# Patient Record
Sex: Male | Born: 1985 | Race: White | Hispanic: No | Marital: Single | Smoking: Never smoker
Health system: Southern US, Community
[De-identification: ages and names within clinical notes are randomized; demographics above are authoritative.]

## PROBLEM LIST (undated history)

## (undated) HISTORY — PX: KNEE SURGERY: SHX244

---

## 2009-04-10 ENCOUNTER — Observation Stay (HOSPITAL_COMMUNITY): Admission: EM | Admit: 2009-04-10 | Discharge: 2009-04-10 | Payer: Self-pay | Admitting: Emergency Medicine

## 2009-04-12 ENCOUNTER — Emergency Department (HOSPITAL_COMMUNITY): Admission: EM | Admit: 2009-04-12 | Discharge: 2009-04-12 | Payer: Self-pay | Admitting: Emergency Medicine

## 2009-05-06 ENCOUNTER — Emergency Department (HOSPITAL_BASED_OUTPATIENT_CLINIC_OR_DEPARTMENT_OTHER): Admission: EM | Admit: 2009-05-06 | Discharge: 2009-05-06 | Payer: Self-pay | Admitting: Emergency Medicine

## 2010-05-23 LAB — DIFFERENTIAL
Basophils Absolute: 0 10*3/uL (ref 0.0–0.1)
Basophils Relative: 0 % (ref 0–1)
Neutro Abs: 4 10*3/uL (ref 1.7–7.7)

## 2010-05-23 LAB — CBC
Hemoglobin: 15.5 g/dL (ref 13.0–17.0)
MCHC: 35.5 g/dL (ref 30.0–36.0)
MCV: 91.7 fL (ref 78.0–100.0)
WBC: 5.5 10*3/uL (ref 4.0–10.5)

## 2010-05-23 LAB — URINALYSIS, ROUTINE W REFLEX MICROSCOPIC
Glucose, UA: NEGATIVE mg/dL
Hgb urine dipstick: NEGATIVE
Hgb urine dipstick: NEGATIVE
Ketones, ur: 40 mg/dL — AB
Ketones, ur: NEGATIVE mg/dL
Leukocytes, UA: NEGATIVE
Nitrite: NEGATIVE
Specific Gravity, Urine: 1.031 — ABNORMAL HIGH (ref 1.005–1.030)
Specific Gravity, Urine: 1.031 — ABNORMAL HIGH (ref 1.005–1.030)
pH: 6 (ref 5.0–8.0)

## 2010-05-23 LAB — POCT I-STAT, CHEM 8
Calcium, Ion: 1.05 mmol/L — ABNORMAL LOW (ref 1.12–1.32)
Creatinine, Ser: 1.5 mg/dL (ref 0.4–1.5)
Glucose, Bld: 118 mg/dL — ABNORMAL HIGH (ref 70–99)
HCT: 47 % (ref 39.0–52.0)
Hemoglobin: 16 g/dL (ref 13.0–17.0)
TCO2: 24 mmol/L (ref 0–100)

## 2010-05-23 LAB — COMPREHENSIVE METABOLIC PANEL
ALT: 18 U/L (ref 0–53)
Albumin: 3.6 g/dL (ref 3.5–5.2)
Alkaline Phosphatase: 41 U/L (ref 39–117)
BUN: 7 mg/dL (ref 6–23)
Calcium: 8.7 mg/dL (ref 8.4–10.5)
Creatinine, Ser: 1.15 mg/dL (ref 0.4–1.5)
GFR calc Af Amer: >60 mL/min (ref >60)
GFR calc non Af Amer: >60 mL/min (ref >60)
Glucose, Bld: 95 mg/dL (ref 70–99)
Total Bilirubin: 1.1 mg/dL (ref 0.3–1.2)

## 2010-05-23 LAB — URINE MICROSCOPIC-ADD ON

## 2010-05-23 LAB — HEMOCCULT GUIAC POC 1CARD (OFFICE): Fecal Occult Bld: NEGATIVE

## 2010-05-23 LAB — LIPASE, BLOOD: Lipase: 21 U/L (ref 11–59)

## 2010-05-27 LAB — DIFFERENTIAL
Basophils Absolute: 0 10*3/uL (ref 0.0–0.1)
Basophils Relative: 1 % (ref 0–1)
Eosinophils Absolute: 0 10*3/uL (ref 0.0–0.7)
Neutro Abs: 5.3 10*3/uL (ref 1.7–7.7)
Neutrophils Relative %: 69 % (ref 43–77)

## 2010-05-27 LAB — CBC
HCT: 44 % (ref 39.0–52.0)
Hemoglobin: 15.2 g/dL (ref 13.0–17.0)
WBC: 7.6 10*3/uL (ref 4.0–10.5)

## 2010-06-01 ENCOUNTER — Emergency Department: Payer: Self-pay | Admitting: Internal Medicine

## 2012-01-19 ENCOUNTER — Emergency Department (HOSPITAL_COMMUNITY)
Admission: EM | Admit: 2012-01-19 | Discharge: 2012-01-19 | Disposition: A | Discharge status: Home / Self Care | Payer: Self-pay | Attending: Emergency Medicine | Admitting: Emergency Medicine

## 2012-01-19 ENCOUNTER — Emergency Department (HOSPITAL_COMMUNITY): Payer: Self-pay

## 2012-01-19 ENCOUNTER — Encounter (HOSPITAL_COMMUNITY): Payer: Self-pay | Admitting: Emergency Medicine

## 2012-01-19 DIAGNOSIS — S46909A Unspecified injury of unspecified muscle, fascia and tendon at shoulder and upper arm level, unspecified arm, initial encounter: Secondary | ICD-10-CM | POA: Insufficient documentation

## 2012-01-19 DIAGNOSIS — S139XXA Sprain of joints and ligaments of unspecified parts of neck, initial encounter: Secondary | ICD-10-CM | POA: Insufficient documentation

## 2012-01-19 DIAGNOSIS — Y9289 Other specified places as the place of occurrence of the external cause: Secondary | ICD-10-CM | POA: Insufficient documentation

## 2012-01-19 DIAGNOSIS — Y939 Activity, unspecified: Secondary | ICD-10-CM | POA: Insufficient documentation

## 2012-01-19 DIAGNOSIS — S4980XA Other specified injuries of shoulder and upper arm, unspecified arm, initial encounter: Secondary | ICD-10-CM | POA: Insufficient documentation

## 2012-01-19 DIAGNOSIS — IMO0002 Reserved for concepts with insufficient information to code with codable children: Secondary | ICD-10-CM | POA: Insufficient documentation

## 2012-01-19 MED ORDER — CYCLOBENZAPRINE HCL 10 MG PO TABS: 10.0000 mg | ORAL_TABLET | Freq: Three times a day (TID) | ORAL | Status: DC | PRN

## 2012-01-19 MED ORDER — HYDROCODONE-ACETAMINOPHEN 5-500 MG PO TABS: 1.0000 | ORAL_TABLET | Freq: Four times a day (QID) | ORAL | Status: DC | PRN

## 2012-01-19 NOTE — ED Notes (Signed)
Patient transported to X-ray 

## 2012-01-19 NOTE — ED Notes (Signed)
C/O neck and shoulder pain x 2 weeks when he was struck in the helmet with a large cable. Denies numbness in arms or fingers. States had some dizziness this am when he bent over.

## 2012-01-19 NOTE — ED Provider Notes (Signed)
History  This chart was scribed for Carleene Cooper III, MD by Shari Heritage, ED Scribe. The patient was seen in room TR06C/TR06C. Patient's care was started at 0926.  CSN: [id number]  Arrival date & time 01/19/12  4098   First MD Initiated Contact with Patient 01/19/12 [phone]      Chief Complaint  Patient presents with  . Neck Pain    The history is provided by the patient. No language interpreter was used.    HPI Comments: Mike Hughes is a 26 y.o. male who presents to the Emergency Department complaining of moderate, constant, non-radiating, dull, gradually worsening neck and right shoulder pain onset 2 weeks ago. Pain is worse with movement. Patient denies numbness and weakness of extremities or LOC at time of incident. Patient states that he was hit with a steel cable while he was wearing a hard hat at work 2 weeks ago. Patient says that his neck jerked and the pain began shortly after the incident. Patient also states that he further aggravated the pain 3 days ago when there was another accident at work. Patient has taken Tylenol, Advil and a OTC pain patch with minimal relief. Patient denies any significant medical or surgical history. He does not take any regular medications. Patient has no known allergies. He does not smoke cigarettes or drink alcohol, but they he chews tobacco.  No family history on file.  History  Substance Use Topics  . Smoking status: Never Smoker   . Smokeless tobacco: Not on file  . Alcohol Use: No    Review of Systems  HENT: Positive for neck pain.   Musculoskeletal: Positive for myalgias.  Neurological: Negative for weakness and numbness.  All other systems reviewed and are negative.   Allergies  Review of patient's allergies indicates no known allergies.  Home Medications  No current outpatient prescriptions on file.  Triage Vitals: BP 139/81  Pulse 71  Temp 98.3 F (36.8 C) (Oral)  Resp 18  SpO2 97%  Physical Exam  Nursing note and  vitals reviewed. Constitutional: He is oriented to person, place, and time. He appears well-developed and well-nourished. No distress.  HENT:  Head: Normocephalic and atraumatic.  Nose: Nose normal.  Mouth/Throat: Oropharynx is clear and moist.  Eyes: Conjunctivae normal and EOM are normal. Pupils are equal, round, and reactive to light.  Neck: Neck supple. No tracheal deviation present.  Cardiovascular: Normal rate.   Pulmonary/Chest: Effort normal. No respiratory distress.  Abdominal: Soft. Bowel sounds are normal. He exhibits no distension. There is no tenderness. There is no rebound.  Musculoskeletal: Normal range of motion. He exhibits tenderness. He exhibits no edema.       Cervical back: He exhibits pain. He exhibits no bony tenderness and no deformity.       Localized pain to posterior cervical paraspinous muscles. No palpable bony deformity of neck. Limited ROM of neck due to pain.  Intact sensation and tendon function in arms and legs.  Neurological: He is alert and oriented to person, place, and time. No sensory deficit.  Skin: Skin is warm and dry.  Psychiatric: He has a normal mood and affect. His behavior is normal.    ED Course  Procedures (including critical care time) DIAGNOSTIC STUDIES: Oxygen Saturation is 97% on room air, adequate by my interpretation.    COORDINATION OF CARE: 9:36 AM- Patient informed of current plan for treatment and evaluation and agrees with plan at this time. Will order x-ray of C-spine.  Dg Cervical Spine Complete  01/19/2012  *RADIOLOGY REPORT*  Clinical Data: Pain.  CERVICAL SPINE - COMPLETE 4+ VIEW  Comparison: None.  Findings: The cervical spine is visualized from the occiput to the cervicothoracic junction.  There is straightening of the normal cervical lordosis.  Alignment is otherwise anatomic.  Vertebral body and disc space height are maintained.  Prevertebral soft tissues are within normal limits.  Neural foramina are patent.  Dens is obscured on the dedicated views.  No degenerative changes. Visualized portions of the lung bases are clear.  IMPRESSION: Straightening of the normal cervical lordosis.  Otherwise negative.   Original Report Authenticated By: Leanna Battles, M.D.      1. Cervical sprain     Rx Flexeril, hydrocodone-acetaminophen.  I personally performed the services described in this documentation, which was scribed in my presence. The recorded information has been reviewed and is accurate. Osvaldo Human, MD     Carleene Cooper III, MD 01/19/12 1011

## 2012-02-08 ENCOUNTER — Emergency Department (HOSPITAL_COMMUNITY): Payer: Self-pay

## 2012-02-08 ENCOUNTER — Emergency Department (HOSPITAL_COMMUNITY)
Admission: EM | Admit: 2012-02-08 | Discharge: 2012-02-08 | Disposition: A | Discharge status: Home / Self Care | Payer: Self-pay | Attending: Emergency Medicine | Admitting: Emergency Medicine

## 2012-02-08 ENCOUNTER — Encounter (HOSPITAL_COMMUNITY): Payer: Self-pay | Admitting: Family Medicine

## 2012-02-08 DIAGNOSIS — R0789 Other chest pain: Secondary | ICD-10-CM

## 2012-02-08 DIAGNOSIS — R071 Chest pain on breathing: Secondary | ICD-10-CM | POA: Insufficient documentation

## 2012-02-08 LAB — CBC WITH DIFFERENTIAL/PLATELET
HCT: 46.1 % (ref 39.0–52.0)
Hemoglobin: 16.4 g/dL (ref 13.0–17.0)
Lymphocytes Relative: 34 % (ref 12–46)
MCH: 31.8 pg (ref 26.0–34.0)
Monocytes Relative: 9 % (ref 3–12)
Neutro Abs: 3.4 10*3/uL (ref 1.7–7.7)
Neutrophils Relative %: 55 % (ref 43–77)
Platelets: 239 10*3/uL (ref 150–400)

## 2012-02-08 LAB — COMPREHENSIVE METABOLIC PANEL
Alkaline Phosphatase: 58 U/L (ref 39–117)
BUN: 10 mg/dL (ref 6–23)
CO2: 28 mEq/L (ref 19–32)
Chloride: 102 mEq/L (ref 96–112)
Glucose, Bld: 84 mg/dL (ref 70–99)
Potassium: 4.1 mEq/L (ref 3.5–5.1)
Sodium: 138 mEq/L (ref 135–145)

## 2012-02-08 LAB — POCT I-STAT TROPONIN I: Troponin i, poc: 0 ng/mL (ref 0.00–0.08)

## 2012-02-08 MED ORDER — MELOXICAM 15 MG PO TABS: 15.0000 mg | ORAL_TABLET | Freq: Every day | ORAL | Status: DC

## 2012-02-08 NOTE — ED Notes (Signed)
Per pt 4 days of central chest pain radiating into his left breast. sts hurts when he moves and breathes in. sts hard to take a deep breath. Denies cough, fever.

## 2012-02-10 NOTE — ED Provider Notes (Signed)
History     CSN: [id number]  Arrival date & time 02/08/12  1604   First MD Initiated Contact with Patient 02/08/12 1645      Chief Complaint  Patient presents with  . Chest Pain    (Consider location/radiation/quality/duration/timing/severity/associated sxs/prior treatment) HPI Comments: Patient co sternal and Left sided CP . Worse with deep inhalation and cough. His work involves heavy lifting. Denies cough or sxs of URI Paitent  Is non smoker and no pmh of PE DVT/ no leg swelling.  RF for ACS include obesity and male gender.  All else negative.    Denies fevers, chills, myalgias, arthralgias. Denies DOE, SOB, chest tightness or pressure, radiation to left arm, jaw or back, or diaphoresis. Denies dysuria, flank pain, suprapubic pain, frequency, urgency, or hematuria. Denies headaches, light headedness, weakness, visual disturbances. Denies abdominal pain, nausea, vomiting, diarrhea or constipation.    Patient is a 26 y.o. male presenting with chest pain. The history is provided by the patient and medical records. No language interpreter was used.  Chest Pain The chest pain began 3 - 5 hours ago. Chest pain occurs constantly. The chest pain is unchanged. The pain is associated with breathing, lifting and coughing. At its most intense, the pain is at 4/10. The pain is currently at 4/10. The severity of the pain is moderate. The quality of the pain is described as sharp, stabbing and aching. The pain does not radiate. Chest pain is worsened by certain positions and deep breathing. Pertinent negatives for primary symptoms include no fever, no fatigue, no syncope, no shortness of breath, no cough, no wheezing, no palpitations, no abdominal pain, no nausea, no vomiting, no dizziness and no altered mental status.  Pertinent negatives for associated symptoms include no claudication, no diaphoresis, no lower extremity edema, no near-syncope, no numbness, no orthopnea, no paroxysmal nocturnal dyspnea  and no weakness. He tried NSAIDs for the symptoms. Risk factors include male gender and obesity.  Pertinent negatives for past medical history include no aneurysm, no anxiety/panic attacks, no aortic aneurysm, no aortic dissection, no arrhythmia, no bicuspid aortic valve, no CAD, no cancer, no congenital heart disease, no connective tissue disease, no COPD, no CHF, no diabetes, no DVT, no hyperhomocysteinemia, no hyperlipidemia, no hypertension, no Kawasaki disease, no Marfan's syndrome, no MI, no mitral valve prolapse, no pacemaker, no PE, no PVD, no recent injury, no rheumatic fever, no seizures, no sickle cell disease, no sleep apnea, no spontaneous pneumothorax, no stimulant use, no strokes, no thyroid problem, no TIA, Turner syndrome and no valve disorder.  Pertinent negatives for family medical history include: no early MI in family and no PE in family.     History reviewed. No pertinent past medical history.  History reviewed. No pertinent past surgical history.  History reviewed. No pertinent family history.  History  Substance Use Topics  . Smoking status: Never Smoker   . Smokeless tobacco: Not on file  . Alcohol Use: No      Review of Systems  Constitutional: Negative for fever, diaphoresis and fatigue.  Respiratory: Negative for cough, shortness of breath and wheezing.   Cardiovascular: Positive for chest pain. Negative for palpitations, orthopnea, claudication, syncope and near-syncope.  Gastrointestinal: Negative for nausea, vomiting and abdominal pain.  Neurological: Negative for dizziness, seizures, weakness and numbness.  Psychiatric/Behavioral: Negative for altered mental status.  Ten systems are reviewed and are negative for acute change except as noted in the HPI   Allergies  Review of patient's allergies indicates no  known allergies.  Home Medications   Current Outpatient Rx  Name  Route  Sig  Dispense  Refill  . MELOXICAM 15 MG PO TABS   Oral   Take 1  tablet (15 mg total) by mouth daily.   15 tablet   0     BP 120/82  Pulse 76  Temp 98.7 F (37.1 C) (Oral)  Resp 17  SpO2 99%  Physical Exam   ED Course  Procedures (including critical care time) Physical Exam  Nursing note and vitals reviewed. Constitutional: He appears well-developed and well-nourished. No distress.  Obese. HENT:  Head: Normocephalic and atraumatic.  Eyes: Conjunctivae normal are normal. No scleral icterus.  Neck: Normal range of motion. Neck supple.  Cardiovascular: Normal rate, regular rhythm and normal heart sounds.   Pulmonary/Chest: Effort normal and breath sounds normal. No respiratory distress.  TTP pectoral origins along R lat border of sternum. Also TTP in Muscle belly of the Pectoralis Minor.  FROM  Right arm. Pain worse with movement of R arm esp abduction and strecthing of the pectoralis mm. Abdominal: Soft. There is no tenderness.  Musculoskeletal: He exhibits no edema.  Neurological: He is alert.  Skin: Skin is warm and dry. He is not diaphoretic.  Psychiatric: His behavior is normal.    Labs Reviewed  COMPREHENSIVE METABOLIC PANEL - Abnormal; Notable for the following:    Total Bilirubin 0.2 (*)     GFR calc non Af Amer 89 (*)     All other components within normal limits  CBC WITH DIFFERENTIAL  POCT I-STAT TROPONIN I  LAB REPORT - SCANNED   Dg Chest 2 View  02/08/2012  *RADIOLOGY REPORT*  Clinical Data: Chest pain  CHEST - 2 VIEW  Comparison: 04/12/2009  Findings: Cardiomediastinal silhouette is stable.  No acute infiltrate or pleural effusion.  No pulmonary edema.  Bony thorax is stable.  IMPRESSION: No active disease.   Original Report Authenticated By: Natasha Mead, M.D.     Date: 02/10/2012  Rate: 88  Rhythm: normal sinus rhythm  QRS Axis: normal  Intervals: normal  ST/T Wave abnormalities: normal  Conduction Disutrbances: none  Narrative Interpretation:   Old EKG Reviewed: N/A     1. Chest wall pain       MDM    Filed Vitals:   02/08/12 1611 02/08/12 1750 02/08/12 1800 02/08/12 1900  BP: 136/87 138/78 126/83 120/82  Pulse: 81 75 81 76  Temp: 98.7 F (37.1 C)     TempSrc: Oral     Resp: 20 20 16 17   SpO2: 99% 98% 98% 99%   VSS. NAD.  CP reproducible with Palpation, Wells, PERC and TIMI for UA/NSTEMI all negative. Negative ekg and troponin/ cxr.  Will D?C with meloxicam/ supportive mesaures. Return precautions discussed. Discussed reasons to seek immediate care. Patient expresses understanding and agrees with plan.       Arthor Captain, PA-C 02/10/12 1437

## 2012-02-12 NOTE — ED Provider Notes (Signed)
Medical screening examination/treatment/procedure(s) were performed by non-physician practitioner and as supervising physician I was immediately available for consultation/collaboration.  Delmas Faucett, MD 02/12/12 1509 

## 2012-12-27 ENCOUNTER — Emergency Department (HOSPITAL_COMMUNITY): Payer: No Typology Code available for payment source

## 2012-12-27 ENCOUNTER — Encounter (HOSPITAL_COMMUNITY): Payer: Self-pay | Admitting: Emergency Medicine

## 2012-12-27 ENCOUNTER — Emergency Department (HOSPITAL_COMMUNITY)
Admission: EM | Admit: 2012-12-27 | Discharge: 2012-12-27 | Disposition: A | Discharge status: Home / Self Care | Payer: No Typology Code available for payment source | Attending: Emergency Medicine | Admitting: Emergency Medicine

## 2012-12-27 DIAGNOSIS — T148XXA Other injury of unspecified body region, initial encounter: Secondary | ICD-10-CM

## 2012-12-27 DIAGNOSIS — X58XXXA Exposure to other specified factors, initial encounter: Secondary | ICD-10-CM | POA: Insufficient documentation

## 2012-12-27 DIAGNOSIS — IMO0002 Reserved for concepts with insufficient information to code with codable children: Secondary | ICD-10-CM | POA: Insufficient documentation

## 2012-12-27 DIAGNOSIS — Y939 Activity, unspecified: Secondary | ICD-10-CM | POA: Insufficient documentation

## 2012-12-27 DIAGNOSIS — Z791 Long term (current) use of non-steroidal anti-inflammatories (NSAID): Secondary | ICD-10-CM | POA: Insufficient documentation

## 2012-12-27 DIAGNOSIS — Y929 Unspecified place or not applicable: Secondary | ICD-10-CM | POA: Insufficient documentation

## 2012-12-27 MED ORDER — OXYCODONE-ACETAMINOPHEN 5-325 MG PO TABS: 2.0000 | ORAL_TABLET | ORAL | Status: AC | PRN

## 2012-12-27 NOTE — ED Notes (Signed)
Walker, NP at bedside for evaluation. 

## 2012-12-27 NOTE — ED Provider Notes (Signed)
CSN: [id number]     Arrival date & time 12/27/12  1237 History  This chart was scribed for non-physician practitioner Irish Elders, NP, working with Shelda Jakes, MD by Dorothey Baseman, ED Scribe. This patient was seen in room TR08C/TR08C and the patient's care was started at 12:51 PM.    Chief Complaint  Patient presents with  . Knee Pain   Patient is a 27 y.o. male presenting with knee pain. The history is provided by the patient. No language interpreter was used.  Knee Pain Location:  Knee Injury: no   Knee location:  L knee Pain details:    Quality:  Unable to specify   Radiates to:  L leg   Severity:  Moderate   Timing:  Constant Chronicity:  New Prior injury to area:  Yes Relieved by:  Rest Worsened by:  Bearing weight and flexion Ineffective treatments:  None tried Associated symptoms: numbness   Associated symptoms: no tingling    HPI Comments: Mike Hughes is a 27 y.o. male who presents to the Emergency Department complaining of a constant pain to the left knee that radiates down into the leg with associated swelling onset 3 weeks ago. The states that the pain is exacerbated with walking, bearing weight, and flexion. He states that the pain is relieved when sitting still. He reports associated numbness to the left leg, from the hip to the ankle. He denies any potential injury to the area, but works doing [employer]. Patient reports a history of prior dislocation to the area in 1998. Patient denies any other pertinent medical history.   No past medical history on file. No past surgical history on file. No family history on file. History  Substance Use Topics  . Smoking status: Never Smoker   . Smokeless tobacco: Not on file  . Alcohol Use: No    Review of Systems  Musculoskeletal: Positive for arthralgias, joint swelling and myalgias.  Neurological: Positive for numbness.  All other systems reviewed and are negative.    Allergies  Review of patient's  allergies indicates no known allergies.  Home Medications   Current Outpatient Rx  Name  Route  Sig  Dispense  Refill  . meloxicam (MOBIC) 15 MG tablet   Oral   Take 1 tablet (15 mg total) by mouth daily.   15 tablet   0    Triage Vitals: BP 152/92  Pulse 88  Temp(Src) 98.3 F (36.8 C) (Oral)  Resp 19  Wt 300 lb (136.079 kg)  SpO2 99%  Physical Exam  Nursing note and vitals reviewed. Constitutional: He is oriented to person, place, and time. He appears well-developed and well-nourished. No distress.  HENT:  Head: Normocephalic and atraumatic.  Eyes: Conjunctivae are normal.  Neck: Normal range of motion. Neck supple.  Cardiovascular:  Good DP pulses.   Pulmonary/Chest: Effort normal. No respiratory distress.  Abdominal: He exhibits no distension.  Musculoskeletal: Normal range of motion. He exhibits edema and tenderness.  Swelling superior to the left patella that is tender to palpation. Good range of motion. Pain with flexion of the left patella.   Neurological: He is alert and oriented to person, place, and time.  Normal strength and sensation throughout.   Skin: Skin is warm and dry.  Psychiatric: He has a normal mood and affect. His behavior is normal.    ED Course  Procedures (including critical care time)  DIAGNOSTIC STUDIES: Oxygen Saturation is 99% on room air, normal by my interpretation.  COORDINATION OF CARE: 12:55 PM- Will order an x-ray of the left knee. Offered patient pain medication, but he refused. Discussed treatment plan with patient at bedside and patient verbalized agreement.   2:30 PM- Discussed that x-ray results do not indicate any fractures or dislocations. Will discharge patient with a knee immobilizer. Advised patient to follow up with the referred orthopaedist. Discussed treatment plan with patient at bedside and patient verbalized agreement.    Labs Review Labs Reviewed - No data to display  Imaging Review Dg Knee Complete 4 Views  Left  12/27/2012   CLINICAL DATA:  Pain and swelling  EXAM: LEFT KNEE - COMPLETE 4+ VIEW  COMPARISON:  None.  FINDINGS: Frontal, lateral, and bilateral oblique views were obtained. There is no demonstrable fracture or dislocation. No effusion. Joint spaces appear intact. No erosive change.  IMPRESSION: No abnormality noted.   Electronically Signed   By: Bretta Bang M.D.   On: 12/27/2012 14:16    EKG Interpretation   None       MDM   1. Sprain    Left knee placed in immobilizer. X-ray; no effusion or acute abnormality. Follow-up with Ortho due to past dislocation of same knee and possible ligamentous injury. Ibuprofen for pain and inflammation. Percocet if needed for break-through pain. RICE discussed with pt and no work for a couple days. Pt agrees with plan.  I personally performed the services described in this documentation, which was scribed in my presence. The recorded information has been reviewed and is accurate.     Irish Elders, NP 12/27/12 [phone]

## 2012-12-27 NOTE — ED Notes (Signed)
Pt c/o left knee swelling x 3 weeks; pt sts hx of knee dislocation in 1998

## 2012-12-28 NOTE — ED Provider Notes (Signed)
Medical screening examination/treatment/procedure(s) were performed by non-physician practitioner and as supervising physician I was immediately available for consultation/collaboration.  EKG Interpretation   None         Elder Davidian W. Ulric Salzman, MD 12/28/12 0722 

## 2014-03-31 ENCOUNTER — Emergency Department (HOSPITAL_COMMUNITY)
Admission: EM | Admit: 2014-03-31 | Discharge: 2014-03-31 | Disposition: A | Discharge status: Home / Self Care | Payer: No Typology Code available for payment source | Attending: Emergency Medicine | Admitting: Emergency Medicine

## 2014-03-31 ENCOUNTER — Encounter (HOSPITAL_COMMUNITY): Payer: Self-pay | Admitting: Physical Medicine and Rehabilitation

## 2014-03-31 DIAGNOSIS — H6691 Otitis media, unspecified, right ear: Secondary | ICD-10-CM | POA: Insufficient documentation

## 2014-03-31 DIAGNOSIS — K029 Dental caries, unspecified: Secondary | ICD-10-CM | POA: Insufficient documentation

## 2014-03-31 DIAGNOSIS — R05 Cough: Secondary | ICD-10-CM | POA: Insufficient documentation

## 2014-03-31 DIAGNOSIS — K0381 Cracked tooth: Secondary | ICD-10-CM | POA: Insufficient documentation

## 2014-03-31 DIAGNOSIS — K088 Other specified disorders of teeth and supporting structures: Secondary | ICD-10-CM | POA: Insufficient documentation

## 2014-03-31 DIAGNOSIS — K002 Abnormalities of size and form of teeth: Secondary | ICD-10-CM | POA: Insufficient documentation

## 2014-03-31 DIAGNOSIS — J029 Acute pharyngitis, unspecified: Secondary | ICD-10-CM | POA: Insufficient documentation

## 2014-03-31 DIAGNOSIS — Z79899 Other long term (current) drug therapy: Secondary | ICD-10-CM | POA: Insufficient documentation

## 2014-03-31 DIAGNOSIS — K0889 Other specified disorders of teeth and supporting structures: Secondary | ICD-10-CM

## 2014-03-31 MED ORDER — AMOXICILLIN 500 MG PO CAPS: 500.0000 mg | ORAL_CAPSULE | Freq: Three times a day (TID) | ORAL | Status: AC

## 2014-03-31 NOTE — ED Notes (Signed)
Pt reports R sided jaw pain, onset last night. States "throbbing" pain upon arrival to ED, rating 7/10. Respirations unlabored. No signs distress noted.

## 2014-03-31 NOTE — Discharge Instructions (Signed)
Please call your doctor for a followup appointment within 24-48 hours. When you talk to your doctor please let them know that you were seen in the emergency department and have them acquire all of your records so that they can discuss the findings with you and formulate a treatment plan to fully care for your new and ongoing problems. Please follow-up with dentist, ear nose and throat physician, health and wellness Center Please take antibiotics as prescribed on a full stomach Please apply warm compressions and massage Please continue to monitor symptoms closely and if symptoms are to worsen or change (fever greater than 101, chills, sweating, nausea, vomiting, chest pain, shortness of breathe, difficulty breathing, weakness, numbness, tingling, worsening or changes to pain pattern, facial swelling, neck pain, neck swelling, inability swallow, coughing up blood, drainage, bleeding from the teeth or gum line, pus drainage from the gum line, ear swelling, pain in the back of the ear, worsening symptoms, drainage from the ear) please report back to the Emergency Department immediately.    Dental Caries Dental caries (also called tooth decay) is the most common oral disease. It can occur at any age but is more common in children and young adults.  HOW DENTAL CARIES DEVELOPS  The process of decay begins when bacteria and foods (particularly sugars and starches) combine in your mouth to produce plaque. Plaque is a substance that sticks to the hard, outer surface of a tooth (enamel). The bacteria in plaque produce acids that attack enamel. These acids may also attack the root surface of a tooth (cementum) if it is exposed. Repeated attacks dissolve these surfaces and create holes in the tooth (cavities). If left untreated, the acids destroy the other layers of the tooth.  RISK FACTORS  Frequent sipping of sugary beverages.   Frequent snacking on sugary and starchy foods, especially those that easily get  stuck in the teeth.   Poor oral hygiene.   Dry mouth.   Substance abuse such as methamphetamine abuse.   Broken or poor-fitting dental restorations.   Eating disorders.   Gastroesophageal reflux disease (GERD).   Certain radiation treatments to the head and neck. SYMPTOMS In the early stages of dental caries, symptoms are seldom present. Sometimes white, chalky areas may be seen on the enamel or other tooth layers. In later stages, symptoms may include:  Pits and holes on the enamel.  Toothache after sweet, hot, or cold foods or drinks are consumed.  Pain around the tooth.  Swelling around the tooth. DIAGNOSIS  Most of the time, dental caries is detected during a regular dental checkup. A diagnosis is made after a thorough medical and dental history is taken and the surfaces of your teeth are checked for signs of dental caries. Sometimes special instruments, such as lasers, are used to check for dental caries. Dental X-ray exams may be taken so that areas not visible to the eye (such as between the contact areas of the teeth) can be checked for cavities.  TREATMENT  If dental caries is in its early stages, it may be reversed with a fluoride treatment or an application of a remineralizing agent at the dental office. Thorough brushing and flossing at home is needed to aid these treatments. If it is in its later stages, treatment depends on the location and extent of tooth destruction:   If a small area of the tooth has been destroyed, the destroyed area will be removed and cavities will be filled with a material such as gold, silver  amalgam, or composite resin.   If a large area of the tooth has been destroyed, the destroyed area will be removed and a cap (crown) will be fitted over the remaining tooth structure.   If the center part of the tooth (pulp) is affected, a procedure called a root canal will be needed before a filling or crown can be placed.   If most of the  tooth has been destroyed, the tooth may need to be pulled (extracted). HOME CARE INSTRUCTIONS You can prevent, stop, or reverse dental caries at home by practicing good oral hygiene. Good oral hygiene includes:  Thoroughly cleaning your teeth at least twice a day with a toothbrush and dental floss.   Using a fluoride toothpaste. A fluoride mouth rinse may also be used if recommended by your dentist or health care provider.   Restricting the amount of sugary and starchy foods and sugary liquids you consume.   Avoiding frequent snacking on these foods and sipping of these liquids.   Keeping regular visits with a dentist for checkups and cleanings. PREVENTION   Practice good oral hygiene.  Consider a dental sealant. A dental sealant is a coating material that is applied by your dentist to the pits and grooves of teeth. The sealant prevents food from being trapped in them. It may protect the teeth for several years.  Ask about fluoride supplements if you live in a community without fluorinated water or with water that has a low fluoride content. Use fluoride supplements as directed by your dentist or health care provider.  Allow fluoride varnish applications to teeth if directed by your dentist or health care provider. Document Released: 11/09/2001 Document Revised: 07/04/2013 Document Reviewed: 02/20/2012 [facility] Patient Information 2015 [place], [place]. This information is not intended to replace advice given to you by your health care provider. Make sure you discuss any questions you have with your health care provider. Otitis Media Otitis media is redness, soreness, and inflammation of the middle ear. Otitis media may be caused by allergies or, most commonly, by infection. Often it occurs as a complication of the common cold. SIGNS AND SYMPTOMS Symptoms of otitis media may include:  Earache.  Fever.  Ringing in your ear.  Headache.  Leakage of fluid from the  ear. DIAGNOSIS To diagnose otitis media, your health care provider will examine your ear with an otoscope. This is an instrument that allows your health care provider to see into your ear in order to examine your eardrum. Your health care provider also will ask you questions about your symptoms. TREATMENT  Typically, otitis media resolves on its own within 3-5 days. Your health care provider may prescribe medicine to ease your symptoms of pain. If otitis media does not resolve within 5 days or is recurrent, your health care provider may prescribe antibiotic medicines if he or she suspects that a bacterial infection is the cause. HOME CARE INSTRUCTIONS   If you were prescribed an antibiotic medicine, finish it all even if you start to feel better.  Take medicines only as directed by your health care provider.  Keep all follow-up visits as directed by your health care provider. SEEK MEDICAL CARE IF:  You have otitis media only in one ear, or bleeding from your nose, or both.  You notice a lump on your neck.  You are not getting better in 3-5 days.  You feel worse instead of better. SEEK IMMEDIATE MEDICAL CARE IF:   You have pain that is not controlled with  medicine.  You have swelling, redness, or pain around your ear or stiffness in your neck.  You notice that part of your face is paralyzed.  You notice that the bone behind your ear (mastoid) is tender when you touch it. MAKE SURE YOU:   Understand these instructions.  Will watch your condition.  Will get help right away if you are not doing well or get worse. Document Released: 11/23/2003 Document Revised: 07/04/2013 Document Reviewed: 09/14/2012 [facility] Patient Information 2015 [place]. This information is not intended to replace advice given to you by your health care provider. Make sure you discuss any questions you have with your health care provider.   Emergency Department Resource Guide 1) Find a Doctor and  Pay Out of Pocket Although you won't have to find out who is covered by your insurance plan, it is a good idea to ask around and get recommendations. You will then need to call the office and see if the doctor you have chosen will accept you as a new patient and what types of options they offer for patients who are self-pay. Some doctors offer discounts or will set up payment plans for their patients who do not have insurance, but you will need to ask so you aren't surprised when you get to your appointment.  2) Contact Your Local Health Department Not all health departments have doctors that can see patients for sick visits, but many do, so it is worth a call to see if yours does. If you don't know where your local health department is, you can check in your phone book. The CDC also has a tool to help you locate your state's health department, and many state websites also have listings of all of their local health departments.  3) Find a Walk-in Clinic If your illness is not likely to be very severe or complicated, you may want to try a walk in clinic. These are popping up all over the country in pharmacies, drugstores, and shopping centers. They're usually staffed by nurse practitioners or physician assistants that have been trained to treat common illnesses and complaints. They're usually fairly quick and inexpensive. However, if you have serious medical issues or chronic medical problems, these are probably not your best option.  No Primary Care Doctor: - Call Health Connect at  [phone] - they can help you locate a primary care doctor that  accepts your insurance, provides certain services, etc. - Physician Referral Service- [phone]  Chronic Pain Problems: Organization         Address  Phone   Notes  [facility]  [phone] Patients need to be referred by their primary care doctor.   Medication Assistance: Organization         Address  Phone   Notes  [facility] Medication [facility] [address] [place], [place] [id number] [phone] --Must be a resident of [facility] [place] -- Must have NO insurance coverage whatsoever (no Medicaid/ Medicare, etc.) -- The pt. MUST have a primary care doctor that directs their care regularly and follows them in the community   MedAssist  [phone]   [employer]  [phone]    Agencies that provide inexpensive medical care: Organization         Address  Phone   Notes  [facility]  [phone]   [facility]    [phone]   [facility] [address] [place], [place] [address] [phone]   [facility] [address] [place] [place] [phone]   Planned Parenthood    6417486716   [facility]    [phone]   [facility]  [address], [place] Phone:  [phone], Fax:  [phone] Hours of Operation:  9 am - 6 pm, M-F.  Also accepts Medicaid/Medicare and self-pay.  [facility]  [address], [place] Phone: [phone], Fax: [phone]. Hours of Operation:  8:30 am - 5:30 pm, M-F.  Also accepts Medicaid and self-pay.  [facility] [place] [address], [place] Phone: [phone]   [facility] [address] Natasha Bence [place], [place] [phone], Ext. [phone] Mondays & Thursdays: 7-9 AM.  First 15 patients are seen on a first come, first serve basis.    Medicaid-accepting [facility] Providers:  Organization         Address  Phone   Notes  [facility] [address], [place] [phone] Also accepts self-pay patients.  [facility] [address] Laurell Josephs [place], [place]  [phone]   [facility] [address], [place] [phone]   [facility] [address] [place] [phone]   Renaye Rakers [address], [place]   [phone] Only accepts [place] Access [place] patients after they have their name applied to their card.   Self-Pay (no insurance) in [facility]:  Organization         Address  Phone   Notes  Sickle Cell Patients, [facility] Internal Medicine [address] [place], [place] [phone]   [facility] [address] [place], [place] [phone]   [facility]  [address], [place] [phone]   [facility]/Dr. Osei-Bonsu  [address] [place] or [address], [place] [phone] Phone number for both [place] and [place] locations is the same.  [facility] [address] [place] [phone]   [facility] [address] [place] or [address] [phone]   [facility] [address], [place] [phone], phone; [phone], fax Sees patients 1st and 3rd Saturday of every month.  Must not qualify for public or private insurance (i.e. Medicaid, Medicare, [place] Health Choice, Veterans' Benefits)  Household income should be no more than 200% of the poverty level The clinic cannot treat you if you are pregnant or think you are pregnant  Sexually transmitted diseases are not treated at the clinic.    Dental Care: Organization         Address  Phone  Notes  [facility] Department of [facility] [address] [place], [place] [phone] Accepts children up to age 8 who are enrolled in [place] or [place] Health Choice; pregnant women with a Medicaid card; and children who have applied for Medicaid or [place] Health Choice, but were declined, whose parents can pay a reduced fee at time of service.  [facility] Department of [facility]  [address], [place] [phone] Accepts children up to age 73 who are enrolled in [place] or [place]  Health Choice; pregnant women with a Medicaid card; and children who have applied for Medicaid or  Health Choice, but were declined, whose parents can pay a reduced fee at time of service.  [facility]  [address] [place], [place] [phone] Patients are seen by appointment only. Walk-ins are not accepted. [facility] will see patients 43 years of age and older. Monday - Tuesday (8am-5pm) Most Wednesdays (8:30-5pm) $30 per visit, cash only  [facility] Adult Dental Access PROGRAM  [address], [facility] [phone] Patients are seen by appointment only. Walk-ins are not accepted. [facility] will see patients 40 years of age and older. One Wednesday Evening (Monthly: Volunteer Based).  $30 per visit, cash only  [employer]  9855226116 for adults; Children under age 44, call Graduate Pediatric Dentistry at [phone]. Children aged 38-14, please call [phone] to request a pediatric application.  Dental services are provided in all areas of dental care including fillings, crowns and bridges, complete and partial dentures, implants, gum treatment, root canals, and extractions. Preventive care is also provided. Treatment is provided to both adults and children. Patients are selected via a lottery and there is often a waiting list.   [facility] [address], [employer] [facility]  [phone] [email or web address]   [facility] [address] [place], [place] [phone], Ext. [phone] Second and Fourth Thursday of each month, opens at 6:30 AM; Clinic ends at 9 AM.  Patients are seen on a first-come first-served basis, and a limited number are seen during each clinic.   [facility]  [address] Ether Griffins [place], [place] [phone]   Eligibility Requirements You must have lived in [place], [place], or  [place] counties for at least the last three months.   You cannot be eligible for state or federal sponsored [place], including [employer], [place], or [employer].   You generally cannot be eligible for healthcare insurance through your employer.    How to apply: Eligibility screenings are held every Tuesday and Wednesday afternoon from 1:00 pm until 4:00 pm. You do not need an appointment for the interview!  [facility] [address] [place], [place] [phone]   [facility] Health Department  [phone]   [facility] Department  [phone]   [facility] Health Department  [phone]    Behavioral Health Resources in the Community: Intensive Outpatient Programs Organization         Address  Phone  Notes  [facility] [address] [place], [place] [phone]   [facility] [address] [place], [place] [phone]   [facility] [address], [place], [place]  [phone]   [facility] [address],  [place], [place] [phone] or [phone]   Substance Abuse Resources Organization         Address  Phone  Notes  [facility]  [phone]   [facility]  [phone]   [facility]  [phone]   Floydene Flock  [phone]   Residential & Outpatient Substance Abuse Program  [phone]   Psychological Services Organization         Address  Phone  Notes  [facility] Behavioral Health  [phone]   [facility] Services  [phone]   [facility] [address] [place] [phone] or [phone]  Mobile Crisis Teams Organization         Address  Phone  Notes  [facility], Mobile Crisis Care Unit  [phone]   [facility]  [address] [place], [place] [phone]   Doristine Locks [address] [place] [phone]    Self-Help/Support Groups Organization         Address  Phone             Notes  [facility] [place] - variety of support groups  336- I7437963 Call for more information  Narcotics Anonymous (NA), [facility] [address], [employer] [place]  2 meetings at this location   Statistician         Address  Phone  Notes  [facility] [address],    [place]  [phone]   [facility]  [address] [place] [address], [place] [address], [place], [place] [phone]   [facility] Treatment Facility [address] [place], [place] [phone] Admissions: 8am-3pm M-F  [facility] [address],    [place], [place] [phone]   [facility] [address] [place], [place], [place] [phone]   [facility] [address],  [place], [place] [phone]   [facility] - Intensive Outpatient [address] Laurell Joseph[address], [place], [place] [phone]   [facility] ([facility]iction [facility] [address] [place].,  [place], [place] [phone] or [phone]   [facility] ([facility]) [address] [place], [place] [phone] Accepts Medicaid  [facility] [address],  [place] [phone] Substance Abuse/Addiction Treatment   [facility] Organization         Address  Phone  Notes  [facility]  [phone]   Angie Fava, PhD [address] Ervin Knack [place], [place]   [phone] or [phone]   [facility] Behavioral   [address] [place], [place] [phone]   [facility] [phone] [place], [place] [phone] Insurance/Medicaid/sponsorship through [facility] and Families [address]                                    [place], [place] [phone] Therapy/tele-psych/case  [facility] [address], [place] [phone]     Dr. Lolly Mustache  [phone]   [facility]  [employer] [facility] Dept. 1) [address] [place] 2) [address] [place] 3)  [address], [place] [phone]  [phone]   [facility] Child Abuse Hotline 2692098241 or [phone] (After Hours)

## 2014-03-31 NOTE — ED Provider Notes (Signed)
CSN: [id number]     Arrival date & time 03/31/14  [id number] History  This chart was scribed for non-physician practitioner, Raymon MuttonMarissa Cuyler Vandyken, PA-C, working with Gilda Creasehristopher J. Pollina, *, by Ronney LionSuzanne Le, ED Scribe. This patient was seen in room TR08C/TR08C and the patient's care was started at 10:14 AM.    Chief Complaint  Patient presents with  . Jaw Pain   The history is provided by the patient. No language interpreter was used.     HPI Comments: Mike Hughes is a 29 y.o. male with no known study can past medical history who presents to the Emergency Department complaining of constant, moderate right sided jaw pain radiating upwards into his right ear with onset 11 hours ago last night. Patient states it originally started out as a sharp shooting pain that then became a dull ache  He denies injury or nighttime teeth grinding. Patient also complains of a broken tooth, but reports it doesn't cause pain. He endorses some dry cough, and burning throat pain that has resolved since this morning. Patient states pain is the same in the morning and at night. Biting down or talking does not affect the pain. He has tried Tylenol, which alleviates the pain. He denies hemoptysis, nasal congestion, fever, blurred vision, sudden loss of vision, neck pain, neck stiffness, neck swelling, chest pain, or SOB.  NKDA. PCP none.  History reviewed. No pertinent past medical history. History reviewed. No pertinent past surgical history. History reviewed. No pertinent family history. History  Substance Use Topics  . Smoking status: Never Smoker   . Smokeless tobacco: Not on file  . Alcohol Use: No    Review of Systems  Constitutional: Negative for fever.  HENT: Positive for dental problem, ear pain and sore throat. Negative for congestion, facial swelling and trouble swallowing.        Jaw pain  Eyes: Negative for visual disturbance.  Respiratory: Positive for cough. Negative for shortness of breath.    Cardiovascular: Negative for chest pain and leg swelling.  Musculoskeletal: Negative for neck pain and neck stiffness.  Neurological: Negative for dizziness.      Allergies  Review of patient's allergies indicates no known allergies.  Home Medications   Prior to Admission medications   Medication Sig Start Date End Date Taking? Authorizing Provider  amoxicillin (AMOXIL) 500 MG capsule Take 1 capsule (500 mg total) by mouth 3 (three) times daily. 03/31/14   Jaymes Revels, PA-C  omeprazole (PRILOSEC) 20 MG capsule Take 20 mg by mouth daily.    Historical Provider, MD  oxyCODONE-acetaminophen (PERCOCET/ROXICET) 5-325 MG per tablet Take 2 tablets by mouth every 4 (four) hours as needed for pain. 12/27/12   Irish EldersKelly Walker, NP   BP 130/80 mmHg  Pulse 68  Temp(Src) 98.9 F (37.2 C) (Oral)  Resp 18  Ht 6\' 2"  (1.88 m)  Wt 280 lb (127.007 kg)  BMI 35.93 kg/m2  SpO2 98% Physical Exam  Constitutional: He is oriented to person, place, and time. He appears well-developed and well-nourished. No distress.  HENT:  Head: Normocephalic and atraumatic.  Right Ear: External ear normal.  Left Ear: External ear normal.  Mouth/Throat: Oropharynx is clear and moist. He does not have dentures. No oral lesions. Normal dentition. Dental caries present. No dental abscesses, uvula swelling or lacerations. No oropharyngeal exudate.    Negative pre-or post irregular swelling identified. Negative pain upon palpation to the posterior aspect of the right ear. Negative lines of mastoiditis. Tympanic membrane identified with opaque fluid  and mild bulging noted. Negative erythema. Negative findings of perforation. Possible beginnings of otitis media. Negative findings of otitis externa.  Negative swelling, erythema, inflammation, lesions, sores, deformities, petechiae or exudate identified to the posterior oropharynx and bilateral tonsils. Negative trismus. Uvula midline with symmetrical elevation. Negative uvula  deviation. Negative sublingual lesions noted. Numerous dental caries-most prominent diagrammed in decaying teeth localized to the right mandibular jawline that are diagrammed in decaying-first premolar and third premolar of the right mandibular jawline noted to be in poor condition. Negative palpable abscess at this time. Negative signs of erythema or inflammation.  Eyes: Conjunctivae and EOM are normal. Pupils are equal, round, and reactive to light. Right eye exhibits no discharge. Left eye exhibits no discharge.  Neck: Normal range of motion. Neck supple. No tracheal deviation present.  Negative neck stiffness Negative nuchal rigidity Negative cervical lymphadenopathy Negative meningeal signs  Cardiovascular: Normal rate, regular rhythm and normal heart sounds.  Exam reveals no friction rub.   No murmur heard. Pulses:      Radial pulses are 2+ on the right side, and 2+ on the left side.  Pulmonary/Chest: Effort normal and breath sounds normal. No respiratory distress. He has no wheezes. He has no rales.  Patient is able to speak in full sentences without difficulty Negative use of excess her muscles Negative stridor  Musculoskeletal: Normal range of motion.  Lymphadenopathy:    He has no cervical adenopathy.  Neurological: He is alert and oriented to person, place, and time. No cranial nerve deficit. He exhibits normal muscle tone. Coordination normal.  Skin: Skin is warm and dry. No rash noted. He is not diaphoretic. No erythema.  Psychiatric: He has a normal mood and affect. His behavior is normal. Thought content normal.  Nursing note and vitals reviewed.   ED Course  Procedures (including critical care time)  DIAGNOSTIC STUDIES: Oxygen Saturation is 98% on room air, normal by my interpretation.    COORDINATION OF CARE: 10:19 AM - Discussed treatment plan with pt at bedside which includes Amoxicillin, Tylenol, and warm compress applications, and pt agreed to plan.   Labs  Review Labs Reviewed - No data to display  Imaging Review No results found.   EKG Interpretation None      MDM   Final diagnoses:  Acute right otitis media, recurrence not specified, unspecified otitis media type  Pain, dental  Dental caries    Medications - No data to display  Filed Vitals:   03/31/14 0915  BP: 130/80  Pulse: 68  Temp: 98.9 F (37.2 C)  TempSrc: Oral  Resp: 18  Height:  (1.88 m)  Weight: 280 lb (127.007 kg)  SpO2: 98%   I personally performed the services described in this documentation, which was scribed in my presence. The recorded information has been reviewed and is accurate.  Patient presenting to emergency department right jaw pain that started approximately 11:30 PM last night. Patient reports that it started as a sharp shooting pain has turned into a dull aching pain. Reports that the pain is radiates up into his right ear. Physical examination identified beginnings of otitis media with opaque fluid behind the right ear and bulging of the tympanic membrane. Negative signs of otitis externa or mastoiditis. Poor dentition identified with numerous dental caries identified to the right mandibular jawline-affecting mainly the third premolar and first premolar of the mandibular jawline. Negative palpable abscess drainable at this time. Doubt Ludwig's angina. Doubt retropharyngeal or peritonsillar abscess. Negative trismus. Patient stable, afebrile.  Patient not septic appearing. Negative signs of respiratory distress. Discharged patient. Discharge patient with antibiotics. Referred patient to ENT, [facility], dentist. Discussed with patient to rest and stay hydrated. Discussed with patient to apply warm compressions and massage. Discussed with patient signs symptoms to watch out for regarding worsening symptoms. Discussed with patient to closely monitor symptoms and if symptoms are to worsen or change to report back to the ED - strict return  instructions given.  Patient agreed to plan of care, understood, all questions answered.   Raymon Mutton, PA-C 03/31/14 1052  Gilda Crease, MD 03/31/14 [phone]

## 2014-04-03 NOTE — Progress Notes (Signed)
ED CM received call from Dr. [facility] Cowboy[facility] office regarding the office needing a ED referral  Summary faxed. ED CM Reviewed record faxed summary via Epic.  No further CM needs identified.

## 2014-04-04 ENCOUNTER — Telehealth: Payer: Self-pay | Admitting: *Deleted

## 2014-04-04 NOTE — Telephone Encounter (Signed)
Office needed additional referral information.

## 2014-04-06 ENCOUNTER — Emergency Department (HOSPITAL_COMMUNITY)
Admission: EM | Admit: 2014-04-06 | Discharge: 2014-04-06 | Disposition: A | Discharge status: Home / Self Care | Payer: No Typology Code available for payment source | Attending: Emergency Medicine | Admitting: Emergency Medicine

## 2014-04-06 ENCOUNTER — Encounter (HOSPITAL_COMMUNITY): Payer: Self-pay | Admitting: Emergency Medicine

## 2014-04-06 DIAGNOSIS — K088 Other specified disorders of teeth and supporting structures: Secondary | ICD-10-CM | POA: Insufficient documentation

## 2014-04-06 DIAGNOSIS — Z792 Long term (current) use of antibiotics: Secondary | ICD-10-CM | POA: Insufficient documentation

## 2014-04-06 DIAGNOSIS — H9201 Otalgia, right ear: Secondary | ICD-10-CM | POA: Insufficient documentation

## 2014-04-06 DIAGNOSIS — K029 Dental caries, unspecified: Secondary | ICD-10-CM | POA: Insufficient documentation

## 2014-04-06 DIAGNOSIS — Z79899 Other long term (current) drug therapy: Secondary | ICD-10-CM | POA: Insufficient documentation

## 2014-04-06 DIAGNOSIS — M542 Cervicalgia: Secondary | ICD-10-CM | POA: Insufficient documentation

## 2014-04-06 MED ORDER — IBUPROFEN 800 MG PO TABS: 800.0000 mg | ORAL_TABLET | Freq: Three times a day (TID) | ORAL | Status: AC

## 2014-04-06 MED ORDER — IBUPROFEN 400 MG PO TABS
800.0000 mg | ORAL_TABLET | Freq: Once | ORAL | Status: AC
  Administered 2014-04-06: 800 mg via ORAL
  Filled 2014-04-06: qty 2

## 2014-04-06 NOTE — ED Notes (Signed)
Patient states had wisdom tooth removed on Tuesday and is still having pain.   Patient states did not contact Dr. Lucky CowboyKnox today, just came back here.

## 2014-04-06 NOTE — ED Provider Notes (Signed)
CSN: [id number]     Arrival date & time 04/06/14  1000 History   This chart was scribed for non-physician practitioner, Fayrene Helper, PA-C, working with Hilario Quarry, MD, by Ronney Lion, ED Scribe. This patient was seen in room TR07C/TR07C and the patient's care was started at 10:13 AM.     Chief Complaint  Patient presents with  . Dental Pain    The history is provided by the patient. No language interpreter was used.     HPI Comments: Mike Hughes is a 28 y.o. male who presents to the Emergency Department complaining of persistent, worsening, right sided jaw pain radiating upwards into his right ear with onset 1 week ago. Patient was seen at the ED here 6 days ago for the same problem, where he was discharged with Amoxicillin for an ear infection, and he has had tooth 32 extracted 2 days ago, but patient reports pain has been persistent, but at a different tooth. He describes the pain as feeling like "someone stuck a needle up in my jaw towards my ear and is moving it around." He complains of associated trouble hearing out of his right ear and stiff, right-sided neck pain. Patient has taken Tylenol with no relief, and states he is completing his course of Amoxicillin today. He denies trouble swallowing. No fever. PCP none.    No past medical history on file. No past surgical history on file. No family history on file. History  Substance Use Topics  . Smoking status: Never Smoker   . Smokeless tobacco: Not on file  . Alcohol Use: No    Review of Systems  HENT: Positive for dental problem and ear pain. Negative for trouble swallowing.   Musculoskeletal: Positive for neck pain.      Allergies  Review of patient's allergies indicates no known allergies.  Home Medications   Prior to Admission medications   Medication Sig Start Date End Date Taking? Authorizing Provider  amoxicillin (AMOXIL) 500 MG capsule Take 1 capsule (500 mg total) by mouth 3 (three) times daily. 03/31/14    Marissa Sciacca, PA-C  omeprazole (PRILOSEC) 20 MG capsule Take 20 mg by mouth daily.    Historical Provider, MD  oxyCODONE-acetaminophen (PERCOCET/ROXICET) 5-325 MG per tablet Take 2 tablets by mouth every 4 (four) hours as needed for pain. 12/27/12   Irish Elders, NP   There were no vitals taken for this visit. Physical Exam  Constitutional: He is oriented to person, place, and time. He appears well-developed and well-nourished. No distress.  HENT:  Head: Normocephalic and atraumatic.  Right Ear: Tympanic membrane normal.  Left Ear: Tympanic membrane normal.  Nose: Nose normal.  Previous signs of extraction at tooth 32. Does not appear infected. Marked decay noted to tooth 30, but no evidence of abscess or gingivitis noted. No trismus or evidence of deep tissue infection. No evidence of mastoiditis or TMJ.   Eyes: Conjunctivae and EOM are normal.  Neck: Normal range of motion. Neck supple. No tracheal deviation present.  No cervical lymphadenopathy noted. Neck with full ROM.   Cardiovascular: Normal rate.   Pulmonary/Chest: Effort normal. No respiratory distress.  Musculoskeletal: Normal range of motion.  Lymphadenopathy:    He has no cervical adenopathy.  Neurological: He is alert and oriented to person, place, and time.  Skin: Skin is warm and dry.  Psychiatric: He has a normal mood and affect. His behavior is normal.  Nursing note and vitals reviewed.   ED Course  Procedures (including  critical care time)  DIAGNOSTIC STUDIES: Oxygen Saturation is 98% on room air, normal by my interpretation.    COORDINATION OF CARE: 10:16 AM - Discussed treatment plan with pt at bedside which includes prescription of high dose ibuprofen, follow-up with dentist, and follow-up with ENT specialist, and pt agreed to plan. Informed patient that because I don't see any signs of infection at this time, I will not prescribe antibiotics. Patient verbalizes understanding and agrees to plan.    Medications  ibuprofen (ADVIL,MOTRIN) tablet 800 mg (800 mg Oral Given 04/06/14 1023)     MDM   Final diagnoses:  Pain due to dental caries   BP 179/94 mmHg  Pulse 76  Temp(Src) 98.1 F (36.7 C) (Oral)  Resp 18  Ht 6\' 2"  (1.88 m)  Wt 280 lb (127.007 kg)  BMI 35.93 kg/m2  SpO2 98% BP elevated, recommend recheck by PCP in 1 week.  I personally performed the services described in this documentation, which was scribed in my presence. The recorded information has been reviewed and is accurate.     Fayrene HelperBowie Nimo Verastegui, PA-C 04/06/14 1026  Hilario Quarryanielle S Ray, MD 04/07/14 [phone]

## 2014-04-06 NOTE — Discharge Instructions (Signed)
Please follow up with your dentist for further management of your dental pain.  If your hearing continue to affect you, then follow up with a ENT specialist for further care.    Dental Extraction A dental extraction procedure refers to a routine tooth extraction performed by your dentist. The procedure depends on where and how the tooth is positioned. The procedure can be very quick, sometimes lasting only seconds. Reasons for dental extraction include:  Tooth decay.  Infections (abscesses).  The need to make room for other teeth.  Gum diseases where the supporting bone has been destroyed.  Fractures of the tooth leaving it unrestorable.  Extra teeth (supernumerary) or grossly malformed teeth.  Baby teeththat have not fallen out in time and have not permitted the the permanent teeth to erupt properly.  In preparation for braces where there is not enough room to align the teeth properly.  Not enough room for wisdom teeth (particularly those that are impacted).  Prior to receiving radiation to the head and neck,teeth in the field of radiation may need to be extracted. LET YOUR CAREGIVER KNOW ABOUT:  Any allergies.  All medicines you are taking:  Including herbs, eye drops, over-the-counter medications, and creams.  Blood thinners (anticoagulants), aspirin, or other drugs that may affect blood clotting.  Use of steroids (through mouth or as creams).  Previous problems with anesthetics, including local anesthetics.  History of bleeding or blood problems.  Previous surgery.  Possibility of pregnancy if this applies.  Smoking history.  Any health problems. RISKS AND COMPLICATIONS As with any procedure, complications may occur, but they can usually be managed by your caregiver. General surgical complications may include:  Reaction to anesthesia.  Damage to surrounding teeth, nerves, tissues, or structures.  Infection.  Bleeding. With appropriate treatment and care  after surgery, the following complications are very uncommon:  Dry socket (blood clot does not form or stay in place over empty socket). This can delay healing.  Incomplete extraction of roots.  Jawbone injury, pain, or weakness. BEFORE THE PROCEDURE  Your dental care provider will:  Take a medical and dental history.  Take an X-ray to evaluate the circumstances and how to best extract the tooth.  Do an oral exam.  Depending on the situation, antibiotics may be given before or after the extraction.  Your caregivers may review the procedure, the local anesthesia and/or sedation being used, and what to expect after the procedure with you.  If needed, your dentist may give you a form of sedation, either by medicine you swallow, gas, or intravenously (IV). This will help to relieve anxiety. Complicated extractions may require the use of general anesthesia. It is important to follow your caregiver's instructions prior to your procedure to avoid complications. Steps before your procedure may include:  Alert your caregiver if you feel ill (sore throat, fever, upset stomach, etc.) in the days leading up to your procedure.  Stop taking certain medications for several days prior to your procedure such as blood thinners.  Take certain medications, such as antibiotics.  Avoid eating and drinking for several hours before the procedure. This will help you to avoid complications from the sedation or anesthesia.  Sign a patient consent form.  Have a friend or family member drive you to the dentist and drive you home after the procedure.  Wear comfortable, loose clothing. Limit makeup and jewelry.  Quit smoking. If you are a smoker, this will raise the chances of a healing problem after your procedure. If  you are thinking about quitting, talk to your surgeon about how long before the operation you should stop smoking. You may also get help from your primary caregiver. PROCEDURE Dental  extraction is typically done as an outpatient procedure. IV sedation, local anesthesia, or both may be used. It will keep you comfortable and free of pain during the procedure.  There are 2 types of extractions:  Simple extraction involves a tooth that is visible in the mouth and above the gum line. After local anesthetic is given by injection, and the area is numbed, the dentist will loosen the tooth with a special instrument (elevator). Then another instrument (forceps) will be used to grasp the tooth and remove it from its socket. During the procedure you will feel some pressure, but you should not feel pain. If you do feel pain, tell your dentist. The open socket will be cleaned. Dressings (gauze) will be placed in the socket to reduce bleeding.  Surgical extractions are used if the tooth has not come into the mouth or the tooth is broken off below the gum line. The dentist will make a cut (incision) in the gum and may have to remove some of the bone around the tooth to aid in the removal of the tooth. After removal, stitches (sutures) may be required to close the area to help in healing and control bleeding. For some surgical extractions, you may need a general anesthetic or IV sedation (through the vein). After both types of extractions, you may be given pain medication or other drugs to help healing. Other postoperative instructions will be given by your dental caregiver. AFTER THE PROCEDURE  You will have gauze in your mouth where the tooth was removed. Gentle pressure on the gauze for up to 1 hour will help to control bleeding.  A blood clot will begin to form over the open socket. This is normal. Do not touch the area or rinse it.  Your pain will be controlled with medication and self-care.  You will be given detailed instructions for care after surgery. PROGNOSIS While some discomfort is normal after tooth extraction, most patients recover fully in just a few days. SEEK IMMEDIATE DENTAL  CARE  You have uncontrolled bleeding, marked swelling, or severe pain.  You develop a fever, difficulty swallowing, or other severe symptoms.  You have questions or concerns. Document Released: 02/17/2005 Document Revised: 07/04/2013 Document Reviewed: 05/24/2010 [facility] Patient Information 2015 [employer] [place]. This information is not intended to replace advice given to you by your health care provider. Make sure you discuss any questions you have with your health care provider.

## 2016-06-29 ENCOUNTER — Emergency Department (HOSPITAL_COMMUNITY)
Admission: EM | Admit: 2016-06-29 | Discharge: 2016-06-30 | Disposition: A | Discharge status: Home / Self Care | Payer: Self-pay | Attending: Emergency Medicine | Admitting: Emergency Medicine

## 2016-06-29 ENCOUNTER — Other Ambulatory Visit: Payer: Self-pay

## 2016-06-29 ENCOUNTER — Emergency Department (HOSPITAL_COMMUNITY): Payer: Self-pay

## 2016-06-29 ENCOUNTER — Encounter (HOSPITAL_COMMUNITY): Payer: Self-pay | Admitting: Emergency Medicine

## 2016-06-29 DIAGNOSIS — Z79899 Other long term (current) drug therapy: Secondary | ICD-10-CM | POA: Insufficient documentation

## 2016-06-29 DIAGNOSIS — R0789 Other chest pain: Secondary | ICD-10-CM | POA: Insufficient documentation

## 2016-06-29 LAB — CBC WITH DIFFERENTIAL/PLATELET
Basophils Absolute: 0 10*3/uL (ref 0.0–0.1)
Basophils Relative: 0 %
Eosinophils Absolute: 0.1 10*3/uL (ref 0.0–0.7)
Eosinophils Relative: 2 %
HCT: 44.3 % (ref 39.0–52.0)
Hemoglobin: 15 g/dL (ref 13.0–17.0)
Lymphocytes Relative: 39 %
Lymphs Abs: 2.5 10*3/uL (ref 0.7–4.0)
MCH: 30.9 pg (ref 26.0–34.0)
MCHC: 33.9 g/dL (ref 30.0–36.0)
MCV: 91.3 fL (ref 78.0–100.0)
Monocytes Absolute: 0.5 10*3/uL (ref 0.1–1.0)
Monocytes Relative: 8 %
Neutro Abs: 3.2 10*3/uL (ref 1.7–7.7)
Neutrophils Relative %: 51 %
Platelets: 229 10*3/uL (ref 150–400)
RBC: 4.85 MIL/uL (ref 4.22–5.81)
RDW: 12.9 % (ref 11.5–15.5)
WBC: 6.4 10*3/uL (ref 4.0–10.5)

## 2016-06-29 LAB — COMPREHENSIVE METABOLIC PANEL
ALT: 34 U/L (ref 17–63)
AST: 24 U/L (ref 15–41)
Albumin: 3.8 g/dL (ref 3.5–5.0)
Alkaline Phosphatase: 54 U/L (ref 38–126)
Anion gap: 9 (ref 5–15)
BUN: 7 mg/dL (ref 6–20)
CO2: 24 mmol/L (ref 22–32)
Calcium: 9.3 mg/dL (ref 8.9–10.3)
Chloride: 105 mmol/L (ref 101–111)
Creatinine, Ser: 1.23 mg/dL (ref 0.61–1.24)
GFR calc Af Amer: >60 mL/min (ref >60)
GFR calc non Af Amer: >60 mL/min (ref >60)
Glucose, Bld: 90 mg/dL (ref 65–99)
Potassium: 3.8 mmol/L (ref 3.5–5.1)
Sodium: 138 mmol/L (ref 135–145)
Total Bilirubin: 0.3 mg/dL (ref 0.3–1.2)
Total Protein: 6.8 g/dL (ref 6.5–8.1)

## 2016-06-29 MED ORDER — KETOROLAC TROMETHAMINE 60 MG/2ML IM SOLN
60.0000 mg | Freq: Once | INTRAMUSCULAR | Status: AC
  Administered 2016-06-29: 60 mg via INTRAMUSCULAR
  Filled 2016-06-29: qty 2

## 2016-06-29 NOTE — ED Notes (Signed)
Back from xray

## 2016-06-29 NOTE — ED Notes (Signed)
Taken to xray at this time. 

## 2016-06-29 NOTE — ED Triage Notes (Signed)
Patient arrives with complaint of LUQ abdominal tingling and burning. States onset 2 weeks ago. Has been intermittent prior to today. This day the sensation has been constant since waking. Denies chest pain, sob, nvd, and fever. States touching the area causes pain. No rash apparent in the area, denies rash presence at any point. EKG performed.

## 2016-06-30 MED ORDER — HYDROCODONE-ACETAMINOPHEN 5-325 MG PO TABS: 1.0000 | ORAL_TABLET | Freq: Four times a day (QID) | ORAL | 0 refills | Status: AC | PRN

## 2016-06-30 MED ORDER — PREDNISONE 50 MG PO TABS: 50.0000 mg | ORAL_TABLET | Freq: Every day | ORAL | 0 refills | Status: AC

## 2016-06-30 NOTE — ED Provider Notes (Signed)
[facility]-EMERGENCY DEPT Provider Note   CSN: [id number] Arrival date & time: 06/29/16  2201     History   Chief Complaint Chief Complaint  Patient presents with  . Abdominal Pain    HPI Mike Hughes is a 31 y.o. male.  HPI Patient presents to the emergency department with pain over the lower anterior ribs.  The patient states that his pain and tingling is been occurring over the last 3 weeks.  Patient states that the sensation is constant, does not seem to get better or worse.  She states touching the area causes more pain.  Patient states that he did not take any medications prior to arrival for his symptoms. The patient denies chest pain, shortness of breath, headache,blurred vision, neck pain, fever, cough, weakness, numbness, dizziness, anorexia, edema, abdominal pain, nausea, vomiting, diarrhea, rash, back pain, dysuria, hematemesis, bloody stool, near syncope, or syncope. History reviewed. No pertinent past medical history.  There are no active problems to display for this patient.   Past Surgical History:  Procedure Laterality Date  . KNEE SURGERY Left        Home Medications    Prior to Admission medications   Medication Sig Start Date End Date Taking? Authorizing Provider  omeprazole (PRILOSEC) 20 MG capsule Take 20 mg by mouth daily.   Yes Historical Provider, MD  amoxicillin (AMOXIL) 500 MG capsule Take 1 capsule (500 mg total) by mouth 3 (three) times daily. Patient not taking: Reported on 06/29/2016 03/31/14   Marissa Sciacca, PA-C  ibuprofen (ADVIL,MOTRIN) 800 MG tablet Take 1 tablet (800 mg total) by mouth 3 (three) times daily. Patient not taking: Reported on 06/29/2016 04/06/14   Fayrene Helper, PA-C  oxyCODONE-acetaminophen (PERCOCET/ROXICET) 5-325 MG per tablet Take 2 tablets by mouth every 4 (four) hours as needed for pain. Patient not taking: Reported on 06/29/2016 12/27/12   Irish Elders, FNP    Family History History reviewed. No pertinent family  history.  Social History Social History  Substance Use Topics  . Smoking status: Never Smoker  . Smokeless tobacco: Never Used  . Alcohol use No     Allergies   Patient has no known allergies.   Review of Systems Review of Systems All other systems negative except as documented in the HPI. All pertinent positives and negatives as reviewed in the HPI.  Physical Exam Updated Vital Signs BP 120/84   Pulse 79   Temp 98.7 F (37.1 C) (Oral)   Resp (!) 21   SpO2 98%   Physical Exam  Constitutional: He is oriented to person, place, and time. He appears well-developed and well-nourished. No distress.  HENT:  Head: Normocephalic and atraumatic.  Mouth/Throat: Oropharynx is clear and moist.  Eyes: Pupils are equal, round, and reactive to light.  Neck: Normal range of motion. Neck supple.  Cardiovascular: Normal rate, regular rhythm and normal heart sounds.  Exam reveals no gallop and no friction rub.   No murmur heard. Pulmonary/Chest: Effort normal and breath sounds normal. No respiratory distress. He has no wheezes.    Abdominal: Soft. Bowel sounds are normal. He exhibits no distension. There is no tenderness.  Neurological: He is alert and oriented to person, place, and time. He exhibits normal muscle tone. Coordination normal.  Skin: Skin is warm and dry. Capillary refill takes less than 2 seconds. No rash noted. No erythema.  Psychiatric: He has a normal mood and affect. His behavior is normal.  Nursing note and vitals reviewed.    ED Treatments /  Results  Labs (all labs ordered are listed, but only abnormal results are displayed) Labs Reviewed  COMPREHENSIVE METABOLIC PANEL  CBC WITH DIFFERENTIAL/PLATELET    EKG  EKG Interpretation None       Radiology Dg Ribs Unilateral W/chest Left  Result Date: 06/30/2016 CLINICAL DATA:  Pain for 3 weeks without trauma. EXAM: LEFT RIBS AND CHEST - 3+ VIEW COMPARISON:  February 08, 2012 FINDINGS: The heart, hila,  mediastinum, lungs, and pleura are normal. No pneumothorax. No bony abnormalities to explain the patient's symptoms. No fractures. IMPRESSION: Negative. Electronically Signed   By: Gerome Sam III M.D   On: 06/30/2016 00:17    Procedures Procedures (including critical care time)  Medications Ordered in ED Medications  ketorolac (TORADOL) injection 60 mg (60 mg Intramuscular Given 06/29/16 2326)     Initial Impression / Assessment and Plan / ED Course  I have reviewed the triage vital signs and the nursing notes.  Pertinent labs & imaging results that were available during my care of the patient were reviewed by me and considered in my medical decision making (see chart for details).     The patient's pain is isolated to a 2 inch to 3 inch section of the lower rib cage just above the abdominal area.  There is no signs of rash, trauma that abdomen is soft and nontender.  Patient does not have any shortness of breath.  The area is worsened with palpation.  This seems to be chest wall irritation rather than any other etiology at this point, I advised the patient to return here as needed  Final Clinical Impressions(s) / ED Diagnoses   Final diagnoses:  None    New Prescriptions New Prescriptions   No medications on file     Charlestine Night, PA-C 06/30/16 0032    Mancel Bale, MD 06/30/16 1511

## 2016-06-30 NOTE — Discharge Instructions (Signed)
Return here as needed.  Follow-up with the primary Dr. for urgent care.  Your chest x-ray did not show any abnormalities and ear.  Laboratory testing was normal.  This is most likely chest wall irritation. we will give medications for this.  Use ice and heat on the area

## 2016-11-20 ENCOUNTER — Other Ambulatory Visit: Payer: Self-pay | Admitting: Family Medicine

## 2016-11-20 DIAGNOSIS — R1030 Lower abdominal pain, unspecified: Secondary | ICD-10-CM

## 2016-11-20 DIAGNOSIS — R1012 Left upper quadrant pain: Secondary | ICD-10-CM

## 2016-11-21 ENCOUNTER — Ambulatory Visit
Admission: RE | Admit: 2016-11-21 | Discharge: 2016-11-21 | Disposition: A | Discharge status: Home / Self Care | Payer: 59 | Source: Ambulatory Visit | Attending: Family Medicine | Admitting: Family Medicine

## 2016-11-21 DIAGNOSIS — R1012 Left upper quadrant pain: Secondary | ICD-10-CM

## 2016-11-21 DIAGNOSIS — R1030 Lower abdominal pain, unspecified: Secondary | ICD-10-CM

## 2016-11-21 MED ORDER — IOPAMIDOL (ISOVUE-300) INJECTION 61%
125.0000 mL | Freq: Once | INTRAVENOUS | Status: AC | PRN
  Administered 2016-11-21: 125 mL via INTRAVENOUS

## 2016-11-22 ENCOUNTER — Encounter: Payer: Self-pay | Admitting: Emergency Medicine

## 2016-11-22 ENCOUNTER — Emergency Department
Admission: EM | Admit: 2016-11-22 | Discharge: 2016-11-22 | Disposition: A | Discharge status: Home / Self Care | Payer: 59 | Attending: Emergency Medicine | Admitting: Emergency Medicine

## 2016-11-22 DIAGNOSIS — Z79899 Other long term (current) drug therapy: Secondary | ICD-10-CM | POA: Insufficient documentation

## 2016-11-22 DIAGNOSIS — R109 Unspecified abdominal pain: Secondary | ICD-10-CM | POA: Diagnosis present

## 2016-11-22 DIAGNOSIS — G894 Chronic pain syndrome: Secondary | ICD-10-CM | POA: Diagnosis not present

## 2016-11-22 DIAGNOSIS — R1012 Left upper quadrant pain: Secondary | ICD-10-CM | POA: Insufficient documentation

## 2016-11-22 LAB — CBC
HCT: 45.4 % (ref 40.0–52.0)
Hemoglobin: 15.9 g/dL (ref 13.0–18.0)
MCH: 31.8 pg (ref 26.0–34.0)
MCHC: 34.9 g/dL (ref 32.0–36.0)
MCV: 91 fL (ref 80.0–100.0)
PLATELETS: 201 10*3/uL (ref 150–440)
RBC: 4.99 MIL/uL (ref 4.40–5.90)
RDW: 13.2 % (ref 11.5–14.5)
WBC: 6.4 10*3/uL (ref 3.8–10.6)

## 2016-11-22 LAB — COMPREHENSIVE METABOLIC PANEL
ALBUMIN: 4.2 g/dL (ref 3.5–5.0)
ALK PHOS: 61 U/L (ref 38–126)
ALT: 37 U/L (ref 17–63)
AST: 29 U/L (ref 15–41)
Anion gap: 8 (ref 5–15)
BILIRUBIN TOTAL: 0.7 mg/dL (ref 0.3–1.2)
BUN: 10 mg/dL (ref 6–20)
CO2: 25 mmol/L (ref 22–32)
CREATININE: 1.23 mg/dL (ref 0.61–1.24)
Calcium: 9.2 mg/dL (ref 8.9–10.3)
Chloride: 102 mmol/L (ref 101–111)
GFR calc Af Amer: >60 mL/min (ref >60)
GFR calc non Af Amer: >60 mL/min (ref >60)
GLUCOSE: 90 mg/dL (ref 65–99)
Potassium: 3.8 mmol/L (ref 3.5–5.1)
SODIUM: 135 mmol/L (ref 135–145)
TOTAL PROTEIN: 7.5 g/dL (ref 6.5–8.1)

## 2016-11-22 LAB — URINALYSIS, COMPLETE (UACMP) WITH MICROSCOPIC
Bacteria, UA: NONE SEEN
Bilirubin Urine: NEGATIVE
GLUCOSE, UA: NEGATIVE mg/dL
Ketones, ur: NEGATIVE mg/dL
Leukocytes, UA: NEGATIVE
Nitrite: NEGATIVE
PH: 5 (ref 5.0–8.0)
Protein, ur: 30 mg/dL — AB
SPECIFIC GRAVITY, URINE: 1.028 (ref 1.005–1.030)

## 2016-11-22 LAB — LIPASE, BLOOD: Lipase: 19 U/L (ref 11–51)

## 2016-11-22 LAB — TROPONIN I: Troponin I: <0.03 ng/mL (ref <0.03)

## 2016-11-22 MED ORDER — FAMOTIDINE 20 MG PO TABS
40.0000 mg | ORAL_TABLET | Freq: Once | ORAL | Status: AC
  Administered 2016-11-22: 40 mg via ORAL
  Filled 2016-11-22: qty 2

## 2016-11-22 MED ORDER — LIDOCAINE 5 % EX PTCH
1.0000 | MEDICATED_PATCH | CUTANEOUS | Status: DC
  Administered 2016-11-22: 1 via TRANSDERMAL
  Filled 2016-11-22: qty 1

## 2016-11-22 MED ORDER — GI COCKTAIL ~~LOC~~
30.0000 mL | Freq: Once | ORAL | Status: AC
  Administered 2016-11-22: 30 mL via ORAL
  Filled 2016-11-22: qty 30

## 2016-11-22 NOTE — ED Triage Notes (Signed)
States has had upper L abdominal pain since May. Escalating past week. Nausea. No fevers.

## 2016-11-22 NOTE — ED Provider Notes (Signed)
[facility] Emergency Department Provider Note  ____________________________________________   First MD Initiated Contact with Patient 11/22/16 1843     (approximate)  I have reviewed the triage vital signs and the nursing notes.   HISTORY  Chief Complaint Abdominal Pain    HPI Mike Hughes is a 31 y.o. male who presents to the emergency department with left upper quadrant pain since May. He said he was initially seen and felt to have shingles although he never developed a rash. He was given capsaicin and antivirals. Noted these medications ever helped him. There is no difference in his pain today he is frustrated however and wants an answer. His primary care physician ordered an outpatient CT scan which was performed yesterday and negative. The pain is on his anterior left chest and upper abdomen. It does not radiate. He does have some nausea but no vomiting. He does occasionally have a cough in the morning and a bitter foul taste in his mouth. He had fevers and chills. Nothing seems to make his pain better or worse.    History reviewed. No pertinent past medical history.  There are no active problems to display for this patient.   Past Surgical History:  Procedure Laterality Date  . KNEE SURGERY Left     Prior to Admission medications   Medication Sig Start Date End Date Taking? Authorizing Provider  omeprazole (PRILOSEC) 20 MG capsule Take 20 mg by mouth daily.   Yes [provider]  amoxicillin (AMOXIL) 500 MG capsule Take 1 capsule (500 mg total) by mouth 3 (three) times daily. Patient not taking: Reported on 06/29/2016 03/31/14   Raymon Mutton, PA-C  HYDROcodone-acetaminophen (NORCO/VICODIN) 5-325 MG tablet Take 1 tablet by mouth every 6 (six) hours as needed for moderate pain. Patient not taking: Reported on 11/22/2016 06/30/16   Charlestine Night, PA-C  ibuprofen (ADVIL,MOTRIN) 800 MG tablet Take 1 tablet (800 mg total) by mouth  3 (three) times daily. Patient not taking: Reported on 06/29/2016 04/06/14   Fayrene Helper, PA-C  oxyCODONE-acetaminophen (PERCOCET/ROXICET) 5-325 MG per tablet Take 2 tablets by mouth every 4 (four) hours as needed for pain. Patient not taking: Reported on 06/29/2016 12/27/12   Irish Elders, FNP  predniSONE (DELTASONE) 50 MG tablet Take 1 tablet (50 mg total) by mouth daily with breakfast. Patient not taking: Reported on 11/22/2016 06/30/16   Charlestine Night, PA-C    Allergies Patient has no known allergies.  No family history on file.  Social History Social History  Substance Use Topics  . Smoking status: Never Smoker  . Smokeless tobacco: Never Used  . Alcohol use No    Review of Systems Constitutional: No fever/chills Eyes: No visual changes. ENT: No sore throat. Cardiovascular: Denies chest pain. Respiratory: Denies shortness of breath. Gastrointestinal: Positive abdominal pain.  Positive nausea, no vomiting.  No diarrhea.  No constipation. Genitourinary: Negative for dysuria. Musculoskeletal: Negative for back pain. Skin: Negative for rash. Neurological: Negative for headaches, focal weakness or numbness.   ____________________________________________   PHYSICAL EXAM:  VITAL SIGNS: ED Triage Vitals [11/22/16 1632]  Enc Vitals Group     BP 134/88     Pulse Rate (!) 103     Resp 20     Temp 98.9 F (37.2 C)     Temp src      SpO2 98 %     Weight (!) 356 lb (161.5 kg)     Height 6' (1.829 m)     Head Circumference  Peak Flow      Pain Score 6     Pain Loc      Pain Edu?      Excl. in GC?     Constitutional: Alert and oriented 4 morbidly obese well appearing nontoxic no diaphoresis speaks in full clear sentences Eyes: PERRL EOMI. Head: Atraumatic. Nose: No congestion/rhinnorhea. Mouth/Throat: No trismus Neck: No stridor.   Cardiovascular: Tachycardic rate, regular rhythm. Grossly normal heart sounds.  Good peripheral circulation. Respiratory:  Normal respiratory effort.  No retractions. Lungs CTAB and moving good air Gastrointestinal: Obese soft nondistended nontender no rebound or guarding no peritonitis no McBurney's tenderness negative Rovsing's no costovertebral tenderness Musculoskeletal: No lower extremity edema   Neurologic:  Normal speech and language. No gross focal neurologic deficits are appreciated. Skin:  Skin is warm, dry and intact. No rash noted. Psychiatric: Mood and affect are normal. Speech and behavior are normal.    ____________________________________________   DIFFERENTIAL includes but not limited to  Postherpetic neuralgia, esophageal reflux, esophagitis, biliary colic ____________________________________________   LABS (all labs ordered are listed, but only abnormal results are displayed)  Labs Reviewed  URINALYSIS, COMPLETE (UACMP) WITH MICROSCOPIC - Abnormal; Notable for the following:       Result Value   Color, Urine YELLOW (*)    APPearance CLEAR (*)    Hgb urine dipstick SMALL (*)    Protein, ur 30 (*)    Squamous Epithelial / LPF 0-5 (*)    All other components within normal limits  LIPASE, BLOOD  COMPREHENSIVE METABOLIC PANEL  CBC  TROPONIN I    Blood work reviewed and interpreted by me is normal __________________________________________  EKG   ____________________________________________  RADIOLOGY  CT abdomen and pelvis performed outpatient yesterday reviewed by me no signs of acute disease noted ____________________________________________   PROCEDURES  Procedure(s) performed: no  Procedures  Critical Care performed: no  Observation: no ____________________________________________   INITIAL IMPRESSION / ASSESSMENT AND PLAN / ED COURSE  Pertinent labs & imaging results that were available during my care of the patient were reviewed by me and considered in my medical decision making (see chart for details).  the patient arrives hemodynamically stable very  well-appearing. He has had 4 months of symptoms. His CT scan from yesterday is unremarkable. His abdomen is benign and nonsurgical. I have discussed with him that post herpetic neuralgia is a known diagnosis and it is definitely possible. He feels only marginally improved after GI cocktail. He likely does have a component of esophageal reflux as well given his morbid obesity. I've encouraged him to start taking H2 blocker and follow up with his primary care physician. He is discharged home in improved condition. He verbalizes understanding and agreement with the plan.      ____________________________________________   FINAL CLINICAL IMPRESSION(S) / ED DIAGNOSES  Final diagnoses:  Left upper quadrant pain  Chronic pain syndrome      NEW MEDICATIONS STARTED DURING THIS VISIT:  Discharge Medication List as of 11/22/2016  7:40 PM       Note:  This document was prepared using Dragon voice recognition software and may include unintentional dictation errors.     Merrily Brittle, MD 11/23/16 [phone]

## 2016-11-22 NOTE — Discharge Instructions (Signed)
Please make an appointment to follow-up with your primary care physician this coming week for reevaluation. It is possible that you would benefit from a referral to see a gastroenterologist. Return to the emergency department sooner for any concerns.  It was a pleasure to take care of you today, and thank you for coming to our emergency department.  If you have any questions or concerns before leaving please ask the nurse to grab me and I'm more than happy to go through your aftercare instructions again.  If you were prescribed any opioid pain medication today such as Norco, Vicodin, Percocet, morphine, hydrocodone, or oxycodone please make sure you do not drive when you are taking this medication as it can alter your ability to drive safely.  If you have any concerns once you are home that you are not improving or are in fact getting worse before you can make it to your follow-up appointment, please do not hesitate to call 911 and come back for further evaluation.  Merrily Brittle, MD  Results for orders placed or performed during the hospital encounter of 11/22/16  Lipase, blood  Result Value Ref Range   Lipase 19 11 - 51 U/L  Comprehensive metabolic panel  Result Value Ref Range   Sodium 135 135 - 145 mmol/L   Potassium 3.8 3.5 - 5.1 mmol/L   Chloride 102 101 - 111 mmol/L   CO2 25 22 - 32 mmol/L   Glucose, Bld 90 65 - 99 mg/dL   BUN 10 6 - 20 mg/dL   Creatinine, Ser 9.60 0.61 - 1.24 mg/dL   Calcium 9.2 8.9 - 45.4 mg/dL   Total Protein 7.5 6.5 - 8.1 g/dL   Albumin 4.2 3.5 - 5.0 g/dL   AST 29 15 - 41 U/L   ALT 37 17 - 63 U/L   Alkaline Phosphatase 61 38 - 126 U/L   Total Bilirubin 0.7 0.3 - 1.2 mg/dL   GFR calc non Af Amer >60 >60 mL/min   GFR calc Af Amer >60 >60 mL/min   Anion gap 8 5 - 15  CBC  Result Value Ref Range   WBC 6.4 3.8 - 10.6 K/uL   RBC 4.99 4.40 - 5.90 MIL/uL   Hemoglobin 15.9 13.0 - 18.0 g/dL   HCT 09.8 11.9 - 14.7 %   MCV 91.0 80.0 - 100.0 fL   MCH 31.8 26.0  - 34.0 pg   MCHC 34.9 32.0 - 36.0 g/dL   RDW 82.9 56.2 - 13.0 %   Platelets 201 150 - 440 K/uL  Urinalysis, Complete w Microscopic  Result Value Ref Range   Color, Urine YELLOW (A) YELLOW   APPearance CLEAR (A) CLEAR   Specific Gravity, Urine 1.028 1.005 - 1.030   pH 5.0 5.0 - 8.0   Glucose, UA NEGATIVE NEGATIVE mg/dL   Hgb urine dipstick SMALL (A) NEGATIVE   Bilirubin Urine NEGATIVE NEGATIVE   Ketones, ur NEGATIVE NEGATIVE mg/dL   Protein, ur 30 (A) NEGATIVE mg/dL   Nitrite NEGATIVE NEGATIVE   Leukocytes, UA NEGATIVE NEGATIVE   RBC / HPF 0-5 0 - 5 RBC/hpf   WBC, UA 0-5 0 - 5 WBC/hpf   Bacteria, UA NONE SEEN NONE SEEN   Squamous Epithelial / LPF 0-5 (A) NONE SEEN   Mucus PRESENT   Troponin I  Result Value Ref Range   Troponin I <0.03 <0.03 ng/mL   Ct Abdomen Pelvis W Contrast  Result Date: 11/21/2016 CLINICAL DATA:  Left upper quadrant pain for  4 months. EXAM: CT ABDOMEN AND PELVIS WITH CONTRAST TECHNIQUE: Multidetector CT imaging of the abdomen and pelvis was performed using the standard protocol following bolus administration of intravenous contrast. CONTRAST:  ISOVUE-300 IOPAMIDOL (ISOVUE-300) INJECTION 61% COMPARISON:  06/01/2010 FINDINGS: Lower chest: Within normal. Hepatobiliary: Mild diffuse low-attenuation of the liver without focal mass. Gallbladder and biliary tree are normal. Pancreas: Within normal. Spleen: Within normal. Adrenals/Urinary Tract: Adrenal glands are normal. Kidneys are normal in size without hydronephrosis or nephrolithiasis. Ureters and bladder are normal. Stomach/Bowel: The stomach and small bowel are within normal. Appendix is normal. Colon is normal. Vascular/Lymphatic: Vascular structures are within normal. There is no adenopathy. Reproductive: Within normal. Other: No free fluid or focal inflammatory change. No abdominal wall hernia. Musculoskeletal: Minimal degenerate change of the spine. There is grade 2-3 anterolisthesis of L5 on S1 with  associated significant degenerative changes and disc space narrowing unchanged. IMPRESSION: No acute findings in the abdomen/pelvis. Grade 2-3 anterolisthesis of L4 on L5 with associated moderate degenerative changes and disc space narrowing unchanged. Electronically Signed   By: Elberta Fortis M.D.   On: 11/21/2016 14:42

## 2018-12-13 IMAGING — CR DG RIBS W/ CHEST 3+V*L*
6 series · 6 of 6 positions shown · non-contrast
Comparison: February 08, 2012

CLINICAL DATA: Pain for 3 weeks without trauma.

EXAM:
LEFT RIBS AND CHEST - 3+ VIEW

[chest pa]
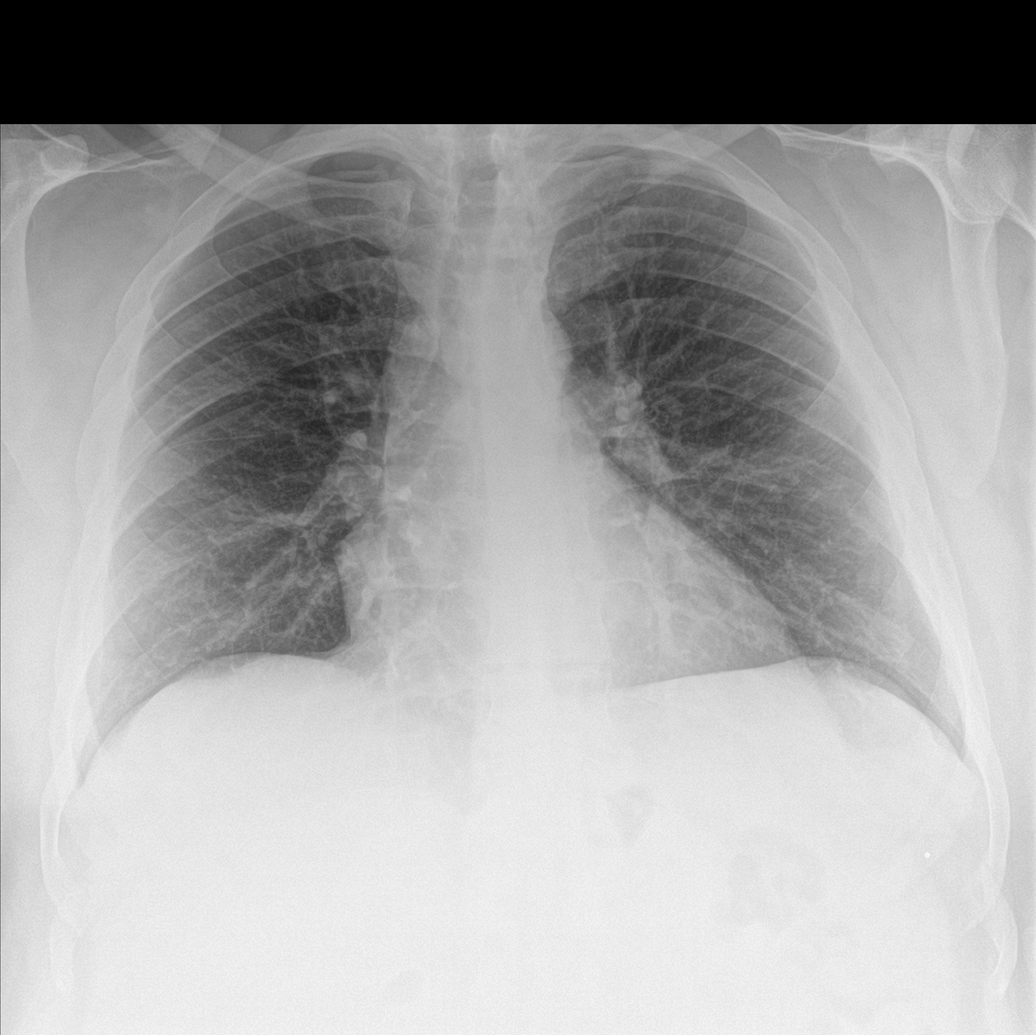

[rib pa obl (1 of 2)]
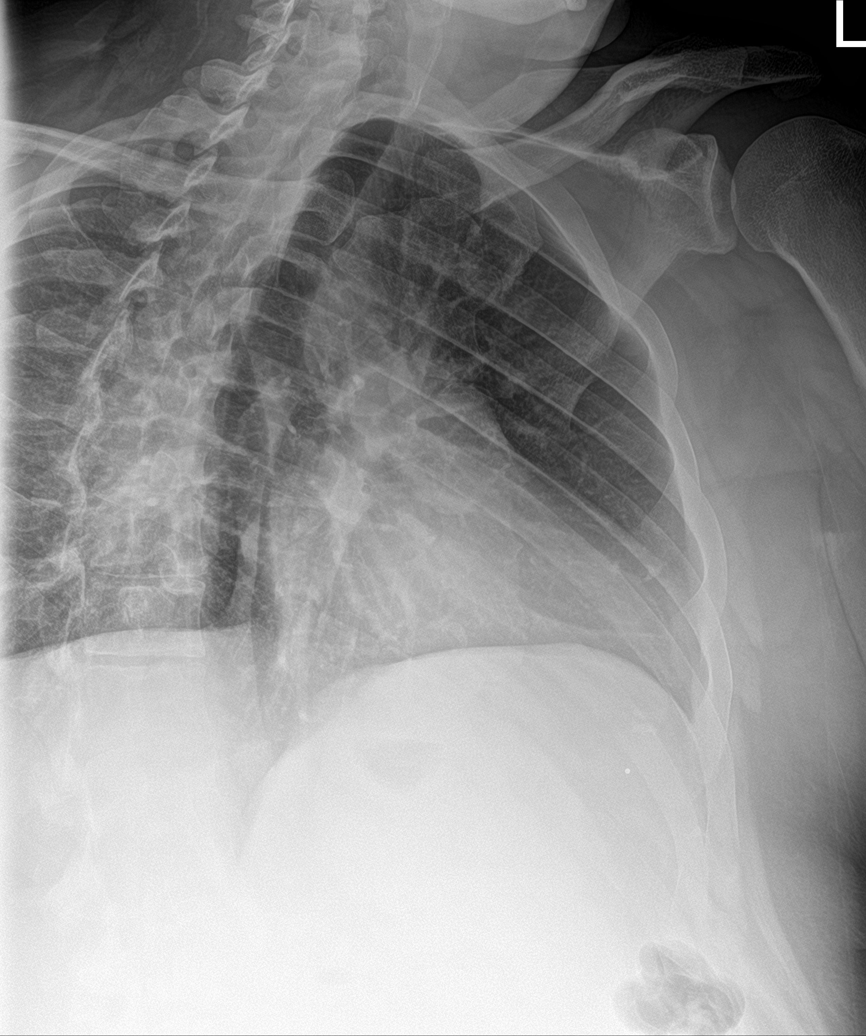

[rib pa obl (2 of 2)]
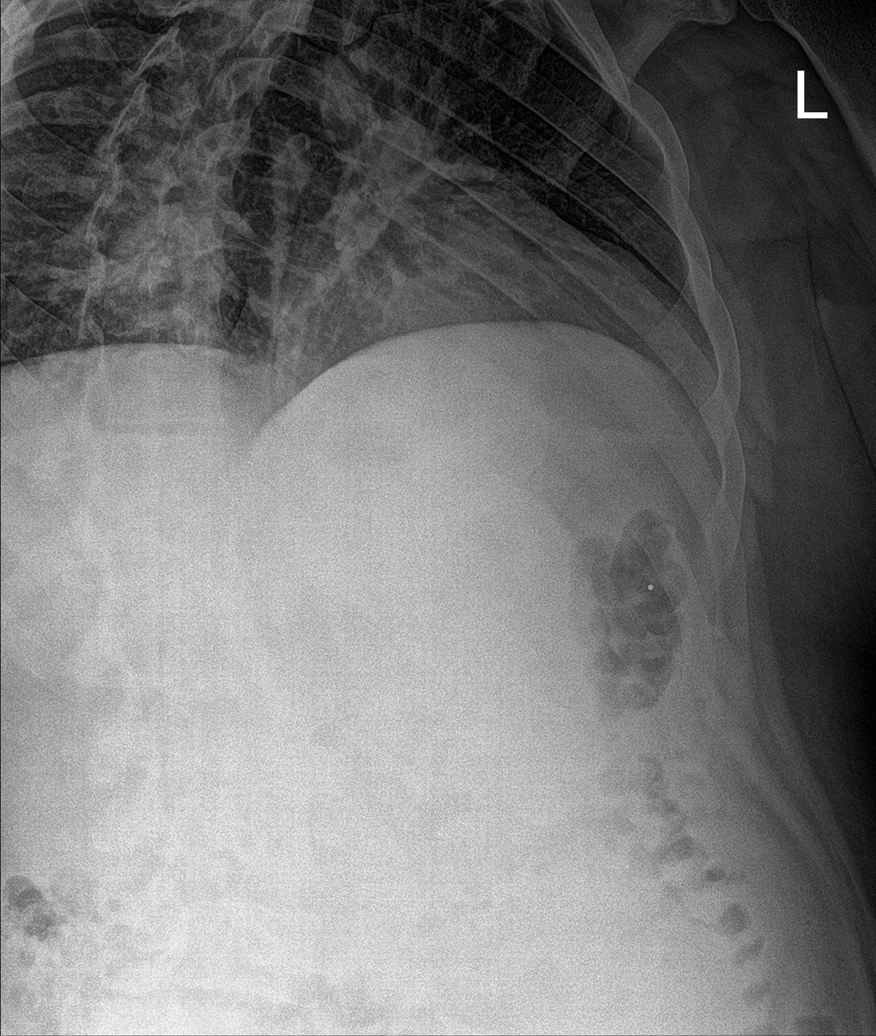

[rib pa (1 of 2)]
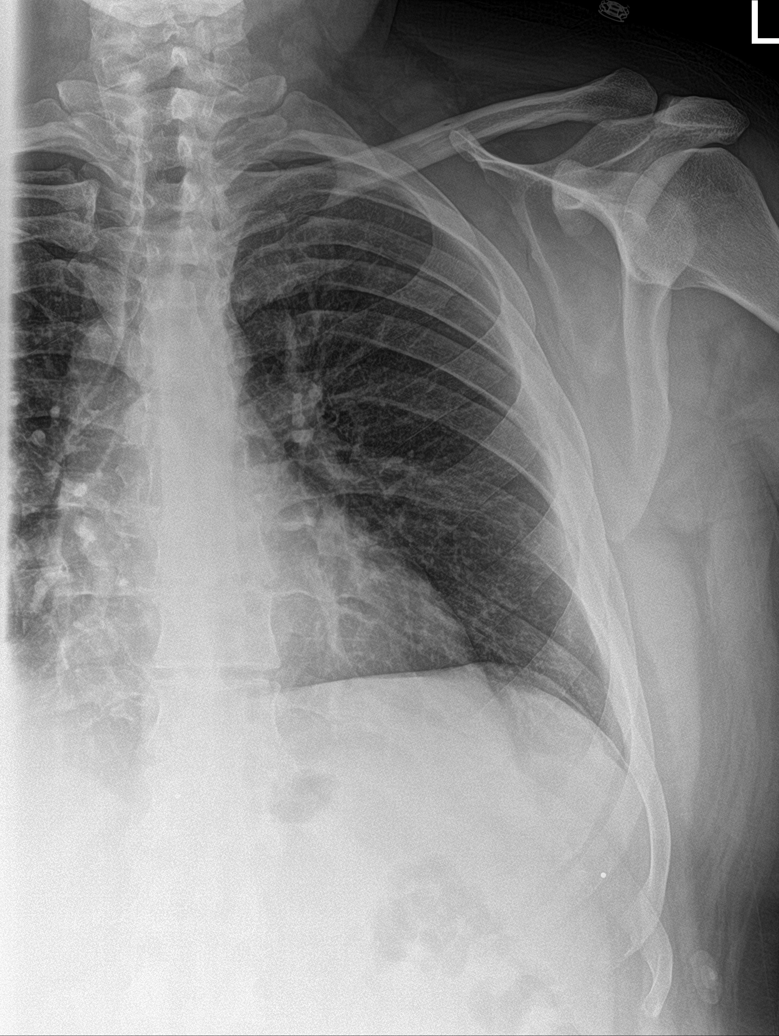

[rib ap]
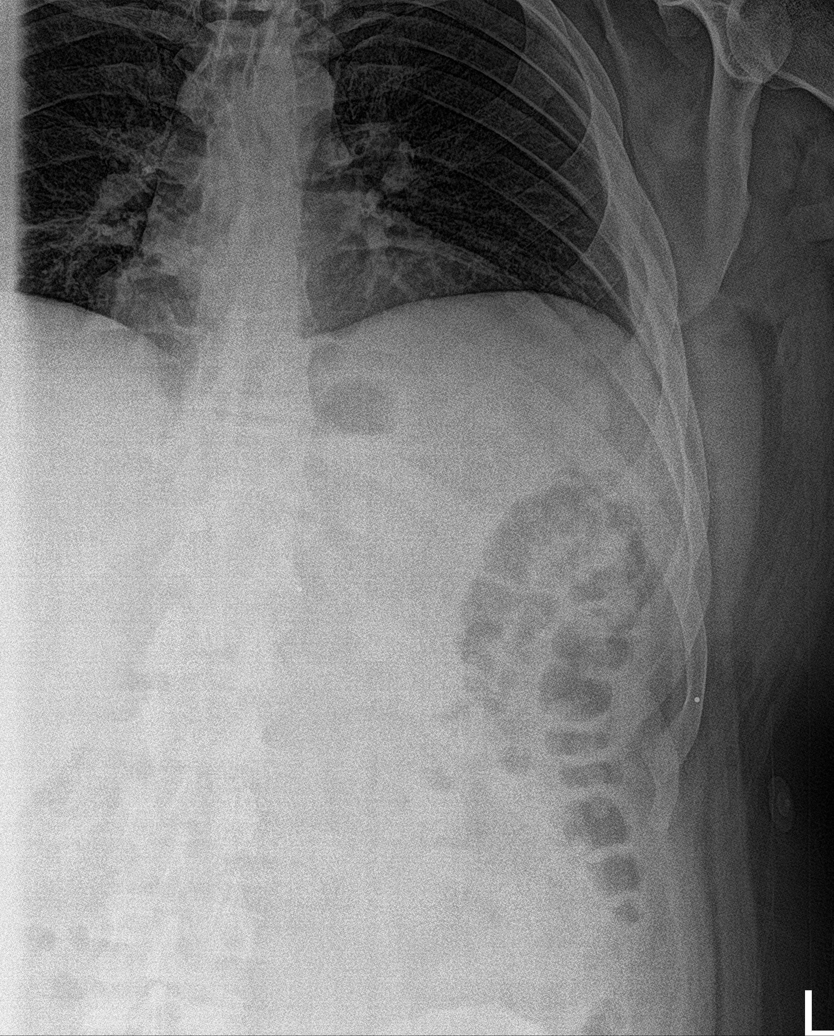

[rib pa (2 of 2)]
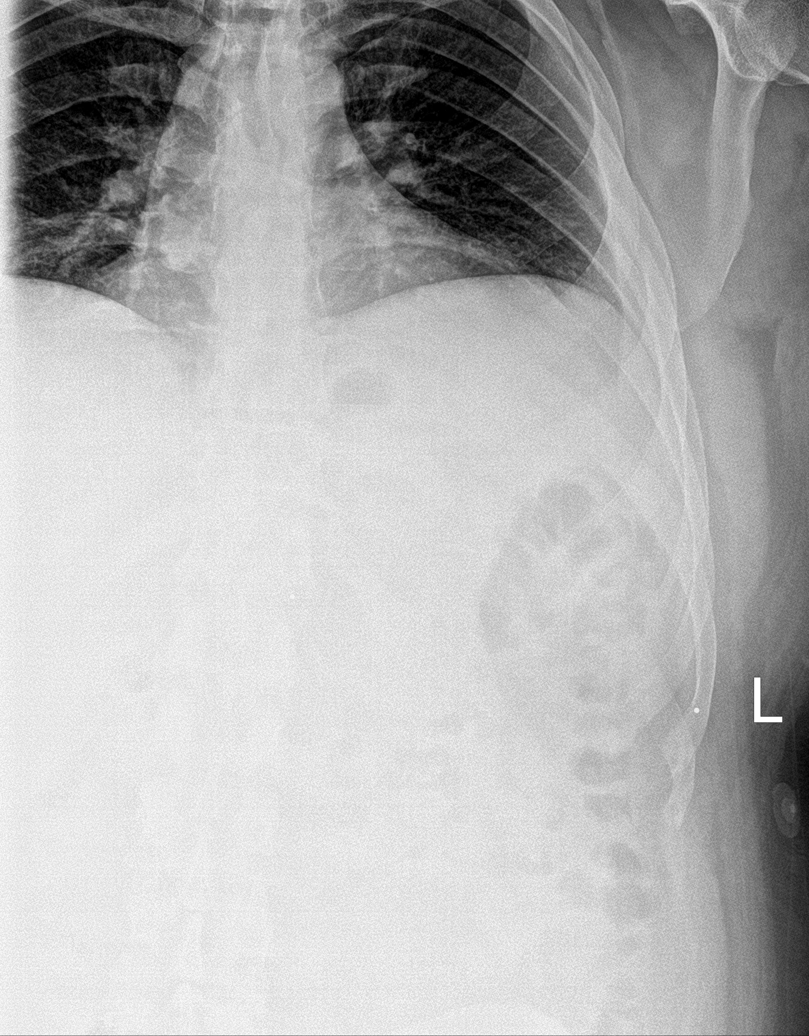

[6 of 6 positions shown; findings below may reference images not displayed]

FINDINGS: The heart, hila, mediastinum, lungs, and pleura are normal. No
pneumothorax. No bony abnormalities to explain the patient's
symptoms. No fractures.
IMPRESSION: Negative.

## 2021-10-02 ENCOUNTER — Other Ambulatory Visit: Payer: Self-pay | Admitting: Nurse Practitioner

## 2021-10-02 ENCOUNTER — Ambulatory Visit: Payer: Self-pay

## 2021-10-02 DIAGNOSIS — Z0000 Encounter for general adult medical examination without abnormal findings: Secondary | ICD-10-CM

## 2021-11-20 ENCOUNTER — Other Ambulatory Visit: Payer: Self-pay | Admitting: Family Medicine

## 2021-11-20 DIAGNOSIS — M545 Low back pain, unspecified: Secondary | ICD-10-CM

## 2021-11-29 ENCOUNTER — Other Ambulatory Visit: Payer: Self-pay
